# Patient Record
Sex: Female | Born: 1965 | Race: Black or African American | Hispanic: No | Marital: Married | Smoking: Former smoker
Health system: Southern US, Community
[De-identification: ages and names within clinical notes are randomized; demographics above are authoritative.]

## PROBLEM LIST (undated history)

## (undated) DIAGNOSIS — E079 Disorder of thyroid, unspecified: Secondary | ICD-10-CM

## (undated) DIAGNOSIS — M549 Dorsalgia, unspecified: Secondary | ICD-10-CM

## (undated) DIAGNOSIS — E739 Lactose intolerance, unspecified: Secondary | ICD-10-CM

## (undated) DIAGNOSIS — E119 Type 2 diabetes mellitus without complications: Secondary | ICD-10-CM

## (undated) DIAGNOSIS — R600 Localized edema: Secondary | ICD-10-CM

## (undated) DIAGNOSIS — G4730 Sleep apnea, unspecified: Secondary | ICD-10-CM

## (undated) DIAGNOSIS — G8929 Other chronic pain: Secondary | ICD-10-CM

## (undated) DIAGNOSIS — E039 Hypothyroidism, unspecified: Secondary | ICD-10-CM

## (undated) DIAGNOSIS — M797 Fibromyalgia: Secondary | ICD-10-CM

## (undated) DIAGNOSIS — M2550 Pain in unspecified joint: Secondary | ICD-10-CM

## (undated) HISTORY — PX: THYROID SURGERY: SHX805

## (undated) HISTORY — DX: Pain in unspecified joint: M25.50

## (undated) HISTORY — DX: Dorsalgia, unspecified: M54.9

## (undated) HISTORY — DX: Sleep apnea, unspecified: G47.30

## (undated) HISTORY — DX: Type 2 diabetes mellitus without complications: E11.9

## (undated) HISTORY — DX: Hypothyroidism, unspecified: E03.9

## (undated) HISTORY — DX: Other chronic pain: G89.29

## (undated) HISTORY — DX: Lactose intolerance, unspecified: E73.9

## (undated) HISTORY — PX: TUBAL LIGATION: SHX77

## (undated) HISTORY — DX: Localized edema: R60.0

## (undated) HISTORY — DX: Disorder of thyroid, unspecified: E07.9

---

## 2019-03-27 ENCOUNTER — Ambulatory Visit (INDEPENDENT_AMBULATORY_CARE_PROVIDER_SITE_OTHER): Payer: No Typology Code available for payment source | Admitting: Family Medicine

## 2019-03-27 ENCOUNTER — Other Ambulatory Visit: Payer: Self-pay

## 2019-03-27 VITALS — BP 104/70 | HR 70 | Ht 66.0 in | Wt 200.2 lb

## 2019-03-27 DIAGNOSIS — E559 Vitamin D deficiency, unspecified: Secondary | ICD-10-CM

## 2019-03-27 DIAGNOSIS — Z131 Encounter for screening for diabetes mellitus: Secondary | ICD-10-CM

## 2019-03-27 DIAGNOSIS — E119 Type 2 diabetes mellitus without complications: Secondary | ICD-10-CM

## 2019-03-27 DIAGNOSIS — Z634 Disappearance and death of family member: Secondary | ICD-10-CM

## 2019-03-27 DIAGNOSIS — S3992XS Unspecified injury of lower back, sequela: Secondary | ICD-10-CM

## 2019-03-27 DIAGNOSIS — F4320 Adjustment disorder, unspecified: Secondary | ICD-10-CM

## 2019-03-27 DIAGNOSIS — Z8639 Personal history of other endocrine, nutritional and metabolic disease: Secondary | ICD-10-CM

## 2019-03-27 DIAGNOSIS — Z0000 Encounter for general adult medical examination without abnormal findings: Secondary | ICD-10-CM

## 2019-03-27 DIAGNOSIS — E0500 Thyrotoxicosis with diffuse goiter without thyrotoxic crisis or storm: Secondary | ICD-10-CM

## 2019-03-27 LAB — POCT GLYCOSYLATED HEMOGLOBIN (HGB A1C): Hemoglobin A1C: 6.4 % — AB (ref 4.0–5.6)

## 2019-03-27 NOTE — Patient Instructions (Signed)
It was lovely to meet you!  1. We are checking some labs for your thyroid health, diabetes, cholesterol and kidney function. I will let you know about any abnormalities.   2. I will check with our behavioral health specialists and refer you for therapy for bereavement.   Be Well!  Dr. Leary Roca
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
## 2019-03-28 LAB — LIPID PANEL
Chol/HDL Ratio: 4.2 ratio (ref 0.0–4.4)
Cholesterol, Total: 182 mg/dL (ref 100–199)
HDL: 43 mg/dL (ref >39)
LDL Chol Calc (NIH): 106 mg/dL — ABNORMAL HIGH (ref 0–99)
Triglycerides: 192 mg/dL — ABNORMAL HIGH (ref 0–149)
VLDL Cholesterol Cal: 33 mg/dL (ref 5–40)

## 2019-03-28 LAB — BASIC METABOLIC PANEL
BUN/Creatinine Ratio: 10 (ref 9–23)
BUN: 13 mg/dL (ref 6–24)
CO2: 22 mmol/L (ref 20–29)
Calcium: 9.7 mg/dL (ref 8.7–10.2)
Chloride: 103 mmol/L (ref 96–106)
Creatinine, Ser: 1.25 mg/dL — ABNORMAL HIGH (ref 0.57–1.00)
GFR calc Af Amer: 57 mL/min/{1.73_m2} — ABNORMAL LOW (ref >59)
GFR calc non Af Amer: 49 mL/min/{1.73_m2} — ABNORMAL LOW (ref >59)
Glucose: 79 mg/dL (ref 65–99)
Potassium: 4.2 mmol/L (ref 3.5–5.2)
Sodium: 143 mmol/L (ref 134–144)

## 2019-03-28 LAB — TSH: TSH: 93.4 u[IU]/mL — ABNORMAL HIGH (ref 0.450–4.500)

## 2019-03-28 LAB — VITAMIN D 25 HYDROXY (VIT D DEFICIENCY, FRACTURES): Vit D, 25-Hydroxy: 19.7 ng/mL — ABNORMAL LOW (ref 30.0–100.0)

## 2019-03-28 LAB — T4, FREE: Free T4: 0.19 ng/dL — ABNORMAL LOW (ref 0.82–1.77)

## 2019-03-29 ENCOUNTER — Encounter: Payer: Self-pay | Admitting: Family Medicine

## 2019-03-29 DIAGNOSIS — E119 Type 2 diabetes mellitus without complications: Secondary | ICD-10-CM | POA: Insufficient documentation

## 2019-03-29 DIAGNOSIS — E0500 Thyrotoxicosis with diffuse goiter without thyrotoxic crisis or storm: Secondary | ICD-10-CM | POA: Insufficient documentation

## 2019-03-29 DIAGNOSIS — E559 Vitamin D deficiency, unspecified: Secondary | ICD-10-CM | POA: Insufficient documentation

## 2019-03-29 DIAGNOSIS — Z0000 Encounter for general adult medical examination without abnormal findings: Secondary | ICD-10-CM | POA: Insufficient documentation

## 2019-03-29 DIAGNOSIS — F4320 Adjustment disorder, unspecified: Secondary | ICD-10-CM | POA: Insufficient documentation

## 2019-03-29 DIAGNOSIS — S3992XS Unspecified injury of lower back, sequela: Secondary | ICD-10-CM | POA: Insufficient documentation

## 2019-03-29 NOTE — Assessment & Plan Note (Signed)
Last colonoscopy 2018. Patient does not know recommendations for next screening, she will check records and get back to me.  Cannot remember last pap smear, needs to be done at next exam.  Can do HIV, HCV screens at next exam.  Last mammogram in 2019, due for mammogram this year.

## 2019-03-29 NOTE — Assessment & Plan Note (Addendum)
Patient consistently checks blood sugar at home within acceptable range 120-130s. Patient makes consistent effort to diet and exercise, and has lost about 15-20 lbs in the last few months; she has had to buy new pants and a new belt due to weight loss, doing quite well in this regard. No medications since summer 2020 - Hgb A1c checked today - Will discuss appropriate medications (if any) based on A1c today. Patient has tired glimepiride, metformin, and jardiance in the past. Cannot tolerate metformin, many hypoglycemic episodes with glimepiride. - Will do foot exam, urine microalbumin at next check - Lipid panel, CMP ordered

## 2019-03-29 NOTE — Assessment & Plan Note (Signed)
H/o vit d deficiency. Patient previously on 1000 U Vit D daily. Would like to be rechecked. - Vit D 25 hydroxy obtained

## 2019-03-29 NOTE — Assessment & Plan Note (Signed)
Patient recently lost her mother, experiencing normal sx of bereavement. Discussed risks and benefits of medical treatment including SSRIs and sleep medication such as trazodone and benadryl. Does not wish medical treatment at this time, but would like to talk to a therapist to process her emotions. - Can take benadryl 25-50 mg PO qhs PRN - Will cc chart to Dr. Shawnee Knapp (on for behavioral health today) for recommendations re: bereavement therapy
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
## 2019-03-29 NOTE — Assessment & Plan Note (Signed)
Previously on levothyroxine 127 mcg. Not on any medications since summer 2020. - TSH and FT4 today - Based on labs will determine if patient requires medication

## 2019-04-07 ENCOUNTER — Telehealth: Payer: Self-pay

## 2019-04-07 NOTE — Telephone Encounter (Signed)
Patient calls nurse line requesting lab results from office visit on 03/27/19. Patient has questions regarding medication management related to these lab results.   Patient also has not heard back regarding referral for therapist.    To PCP  Please advise  Veronda Prude, RN

## 2019-04-08 ENCOUNTER — Telehealth: Payer: Self-pay | Admitting: Psychology

## 2019-04-08 NOTE — Telephone Encounter (Signed)
Called and left VM about scheduling appt.

## 2019-04-08 NOTE — Telephone Encounter (Signed)
We have an appointment next week for follow up. Will discuss labs then. I am currently on nights. Will forward for referral re: therapy.  Shirlean Mylar, MD [facility], PGY-1

## 2019-04-16 ENCOUNTER — Other Ambulatory Visit: Payer: Self-pay

## 2019-04-16 ENCOUNTER — Encounter: Payer: Self-pay | Admitting: Family Medicine

## 2019-04-16 ENCOUNTER — Ambulatory Visit (INDEPENDENT_AMBULATORY_CARE_PROVIDER_SITE_OTHER): Payer: No Typology Code available for payment source | Admitting: Family Medicine

## 2019-04-16 VITALS — BP 110/86 | HR 69 | Wt 202.0 lb

## 2019-04-16 DIAGNOSIS — E119 Type 2 diabetes mellitus without complications: Secondary | ICD-10-CM | POA: Diagnosis not present

## 2019-04-16 DIAGNOSIS — F4320 Adjustment disorder, unspecified: Secondary | ICD-10-CM

## 2019-04-16 DIAGNOSIS — S3992XS Unspecified injury of lower back, sequela: Secondary | ICD-10-CM

## 2019-04-16 DIAGNOSIS — Z634 Disappearance and death of family member: Secondary | ICD-10-CM

## 2019-04-16 DIAGNOSIS — M5442 Lumbago with sciatica, left side: Secondary | ICD-10-CM

## 2019-04-16 DIAGNOSIS — E0500 Thyrotoxicosis with diffuse goiter without thyrotoxic crisis or storm: Secondary | ICD-10-CM

## 2019-04-16 DIAGNOSIS — M5441 Lumbago with sciatica, right side: Secondary | ICD-10-CM

## 2019-04-16 MED ORDER — LEVOTHYROXINE SODIUM 125 MCG PO TABS: 125.0000 ug | ORAL_TABLET | ORAL | 3 refills | Status: DC
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
## 2019-04-16 NOTE — Patient Instructions (Signed)
It was a pleasure seeing you today!  Please follow up with Dr. Shawnee Knapp.   I have given you the number of our nutritionist, Dr. Gerilyn Pilgrim. Call her number to schedule an appointment.   For your back pain: I recommend continuing your home stretching and exercise. You can use heat as needed, and voltaryn gel. I have ordered back x-rays for you, please see the hand out for instructions.  Your levothyroxine should be ready at your pharmacy.  I will follow up with you in 2 months, unless your pain is worse.  Be Well!  Dr. Leary Roca

## 2019-04-18 ENCOUNTER — Encounter: Payer: Self-pay | Admitting: Family Medicine

## 2019-04-18 NOTE — Assessment & Plan Note (Signed)
Levothyroxine 125 mcg prescribed, this was the last dose she was stable on. Will recheck in 2 months.

## 2019-04-18 NOTE — Assessment & Plan Note (Signed)
Continue diet and exercise regimen. Will recheck A1c in 6 months.

## 2019-04-18 NOTE — Assessment & Plan Note (Signed)
Due to tenderness on central column 1 month after injury, recommend x-rays of thoracic and lumbar spine to rule out fracture or lesion. No red flags. Most likely musculoskeletal from injury, expect that since it has improved already, it will continue to improve.  -Offered PT, patient does not yet want to go to PT as she is improving -Continue ice and OTC analgesics

## 2019-04-18 NOTE — Assessment & Plan Note (Signed)
Patient is improving, but still would like therapy. She will make an appointment with Dr. Shawnee Knapp.

## 2019-04-21 ENCOUNTER — Ambulatory Visit
Admission: RE | Admit: 2019-04-21 | Discharge: 2019-04-21 | Disposition: A | Discharge status: Home / Self Care | Payer: No Typology Code available for payment source | Source: Ambulatory Visit | Attending: Family Medicine | Admitting: Family Medicine

## 2019-04-21 ENCOUNTER — Other Ambulatory Visit: Payer: Self-pay | Admitting: Family Medicine

## 2019-04-21 DIAGNOSIS — M5441 Lumbago with sciatica, right side: Secondary | ICD-10-CM

## 2019-04-21 DIAGNOSIS — M5442 Lumbago with sciatica, left side: Secondary | ICD-10-CM

## 2019-04-21 DIAGNOSIS — E0500 Thyrotoxicosis with diffuse goiter without thyrotoxic crisis or storm: Secondary | ICD-10-CM

## 2019-04-22 ENCOUNTER — Telehealth: Payer: Self-pay | Admitting: Psychology

## 2019-04-22 ENCOUNTER — Ambulatory Visit: Payer: No Typology Code available for payment source | Admitting: Psychology

## 2019-04-22 NOTE — Telephone Encounter (Signed)
Pt called upset reporting she had a mental break-down in [employer].  Pt denied HI/SI and reported she is just overwhelmed.  Pt requested appt with Dr. Shawnee Knapp.  Scheduled appt for 3/17 at 830AM.    Pt was given suicide hotline number and information about Behavioral Health Urgent care at [facility].

## 2019-04-29 ENCOUNTER — Other Ambulatory Visit: Payer: Self-pay

## 2019-04-29 ENCOUNTER — Ambulatory Visit (INDEPENDENT_AMBULATORY_CARE_PROVIDER_SITE_OTHER): Payer: No Typology Code available for payment source | Admitting: Psychology

## 2019-04-29 DIAGNOSIS — F4321 Adjustment disorder with depressed mood: Secondary | ICD-10-CM | POA: Diagnosis not present

## 2019-04-29 NOTE — BH Specialist Note (Signed)
Integrated Behavioral Health Visit   04/29/2019 Donna Cowan [id number]   Session Start time: 830  Session End time: 9 Total time: 30   PRESENTING CONCERNS: Patient and/or family reports the following symptoms/concerns: Pt reported she lost her mother in January and has been struggling with the grieving process.  PT reported she has lots of thoughts about her and misses her. Pt reported have a "meltdown" last week in [place] where she has a memory of her mother and became paralyzed with emotion.  Pt and Dr. Shawnee Knapp talked through process of grieving and grace needed to move through process.   Pt denied SI.  Duration of problem: 3 months; Severity of problem: mild  STRENGTHS (Protective Factors/Coping Skills): Supportive family   GOALS ADDRESSED: Patient will: 1.  Reduce symptoms of: depression: syx of sadness and loss 2.  Increase knowledge and/or ability of: coping skills: pt need some coping skills to move through grief. 3.  Demonstrate ability to: Begin healthy grieving over loss  INTERVENTIONS: Interventions utilized:  Supportive Counseling Standardized Assessments completed: Not Needed  ASSESSMENT: Patient currently experiencing grief due to loss of her mother.   Patient may benefit from engaging in coping skills to move through grief and supportive counseling.  PLAN: 1. Follow up with behavioral health clinician on : grief process  2. Behavioral recommendations: supportive therapy 3. Referral(s): [facility] [employer] (In Clinic)  Royetta Asal, PhD., LMFT-A

## 2019-05-13 ENCOUNTER — Other Ambulatory Visit: Payer: Self-pay

## 2019-05-13 ENCOUNTER — Ambulatory Visit (INDEPENDENT_AMBULATORY_CARE_PROVIDER_SITE_OTHER): Payer: No Typology Code available for payment source | Admitting: Psychology

## 2019-05-13 DIAGNOSIS — F4321 Adjustment disorder with depressed mood: Secondary | ICD-10-CM

## 2019-05-13 NOTE — BH Specialist Note (Signed)
Integrated Behavioral Health Visit  05/13/2019 Donna Cowan [id number]   Session Start time: 830  Session End time: 910 Total time: 40   Referring Provider: Dr. Leary Roca   PRESENTING CONCERNS: Patient and/or family reports the following symptoms/concerns: Pt reported she is still experiencing grief syx.  Pt reports she struggles with anger and sadness.  Pt and Dr. Shawnee Knapp discussed process of grief and how sharing where she is can be helpful for her supports.   Duration of problem: since January; Severity of problem: mild  STRENGTHS (Protective Factors/Coping Skills): Supports in place   GOALS ADDRESSED: Patient will: 1.  Reduce symptoms of: grief : sadness, loss, anger, weight loss (20lbs) 2.  Increase knowledge and/or ability of: coping skills: pt needs some coping skills and self-management strategies to move through grief  3.  Demonstrate ability to: Begin healthy grieving over loss  INTERVENTIONS: Interventions utilized:  Supportive Counseling Standardized Assessments completed: Not Needed  ASSESSMENT: Patient currently experiencing grief due to the loss of her mother.   Patient may benefit from engaging in coping skills and supportive counseling to move through grief.   PLAN: 1. Follow up with behavioral health clinician on : grief process; pt's [facility] insurance coverage and if needed referral out. 2. Behavioral recommendations: supportive therapy 3. Referral(s): [facility] [employer] (In Clinic)  Royetta Asal, PhD., LMFT-A

## 2019-08-12 ENCOUNTER — Other Ambulatory Visit: Payer: Self-pay

## 2019-08-12 ENCOUNTER — Encounter (INDEPENDENT_AMBULATORY_CARE_PROVIDER_SITE_OTHER): Payer: Self-pay | Admitting: Bariatrics

## 2019-08-12 ENCOUNTER — Ambulatory Visit (INDEPENDENT_AMBULATORY_CARE_PROVIDER_SITE_OTHER): Payer: No Typology Code available for payment source | Admitting: Bariatrics

## 2019-08-12 VITALS — BP 129/92 | HR 60 | Temp 98.4°F | Ht 67.0 in | Wt 202.0 lb

## 2019-08-12 DIAGNOSIS — E119 Type 2 diabetes mellitus without complications: Secondary | ICD-10-CM

## 2019-08-12 DIAGNOSIS — E6609 Other obesity due to excess calories: Secondary | ICD-10-CM

## 2019-08-12 DIAGNOSIS — Z6831 Body mass index (BMI) 31.0-31.9, adult: Secondary | ICD-10-CM

## 2019-08-12 DIAGNOSIS — Z9189 Other specified personal risk factors, not elsewhere classified: Secondary | ICD-10-CM

## 2019-08-12 DIAGNOSIS — R5383 Other fatigue: Secondary | ICD-10-CM | POA: Diagnosis not present

## 2019-08-12 DIAGNOSIS — E669 Obesity, unspecified: Secondary | ICD-10-CM

## 2019-08-12 DIAGNOSIS — F3289 Other specified depressive episodes: Secondary | ICD-10-CM

## 2019-08-12 DIAGNOSIS — Z0289 Encounter for other administrative examinations: Secondary | ICD-10-CM

## 2019-08-12 DIAGNOSIS — E559 Vitamin D deficiency, unspecified: Secondary | ICD-10-CM

## 2019-08-12 DIAGNOSIS — R0602 Shortness of breath: Secondary | ICD-10-CM

## 2019-08-12 DIAGNOSIS — E0500 Thyrotoxicosis with diffuse goiter without thyrotoxic crisis or storm: Secondary | ICD-10-CM

## 2019-08-12 NOTE — Progress Notes (Signed)
Chief Complaint:   OBESITY Donna Cowan (MR# [id number]) is a 54 y.o. female who presents for evaluation and treatment of obesity and related comorbidities. Current BMI is Body mass index is 31.64 kg/m.Donna Cowan has been struggling with her weight for many years and has been unsuccessful in either losing weight, maintaining weight loss, or reaching her healthy weight goal.  [name] is currently in the action stage of change and ready to dedicate time achieving and maintaining a healthier weight. [name] is interested in becoming our patient and working on intensive lifestyle modifications including (but not limited to) diet and exercise for weight loss.  Donna Cowan is lactose intolerant. She does like to cook. She sometimes craves "salty stuff."  Donna Cowan's habits were reviewed today and are as follows: Her family eats meals together, she struggles with family and or coworkers weight loss sabotage, her desired weight loss is 27 lbs, she started gaining weight in 2013, her heaviest weight ever was 235 pounds, she sometimes craves salty stuff and sometimes sweet stuff, she snacks frequently in the evenings, she sometimes makes poor food choices, she sometimes eats larger portions than normal, she has binge eating behaviors and she struggles with emotional eating.  Depression Screen Donna Cowan's Food and Mood (modified PHQ-9) score was 10.  Depression screen PHQ 2/9 08/12/2019  Decreased Interest 1  Down, Depressed, Hopeless 1  PHQ - 2 Score 2  Altered sleeping 1  Tired, decreased energy 2  Change in appetite 3  Feeling bad or failure about yourself  2  Trouble concentrating 0  Moving slowly or fidgety/restless 0  Suicidal thoughts 0  PHQ-9 Score 10  Difficult doing work/chores Somewhat difficult   Subjective:   Other fatigue. Theone denies daytime somnolence and denies waking up still tired. Donna Cowan generally gets 7.5 hours of sleep per night, and states that  she has generally restful sleep. Snoring is present. Apneic episodes are present. Epworth Sleepiness Score is 8.  Shortness of breath on exertion. Donna Cowan notes increasing shortness of breath with certain activities and seems to be worsening over time with weight gain. She notes getting out of breath sooner with activity than she used to. This has gotten worse recently. Donna Cowan denies shortness of breath at rest or orthopnea.  Type 2 diabetes mellitus without complication, without long-term current use of insulin (HCC). [name] is on no medication.  Lab Results  Component Value Date   HGBA1C 6.4 (A) 03/27/2019   Lab Results  Component Value Date   LDLCALC 106 (H) 03/27/2019   CREATININE 1.25 (H) 03/27/2019   No results found for: INSULIN  Vitamin D deficiency. Emmarie takes OTC Vitamin D.   Ref. Range 03/27/2019 14:08  Vitamin D, 25-Hydroxy Latest Ref Range: 30.0 - 100.0 ng/mL 19.7 (L)   Graves disease. Daphine is taking Synthroid. Last TSH 93.400 on 03/27/2019.  Other depression, with emotional eating. [name] is struggling with emotional eating and using food for comfort to the extent that it is negatively impacting her health. She has been working on behavior modification techniques to help reduce her emotional eating and has been somewhat successful. She shows no sign of suicidal or homicidal ideations. Donna Cowan endorses emotional eating.  At risk for hypoglycemia. Donna Cowan is at increased risk for hypoglycemia due to diabetes mellitus type II and dietary changes.   Assessment/Plan:   Other fatigue. [name] does feel that her weight is causing her energy to be lower than it should be. Fatigue may be related to obesity, depression or many  other causes. Labs will be ordered, and in the meanwhile, Donna Cowan will focus on self care including making healthy food choices, increasing physical activity and focusing on stress reduction. EKG 12-Lead performed  today.  Shortness of breath on exertion. Donna Cowan does feel that she gets out of breath more easily that she used to when she exercises. Donna Cowan's shortness of breath appears to be obesity related and exercise induced. She has agreed to work on weight loss and gradually increase exercise to treat her exercise induced shortness of breath. Will continue to monitor closely. Lipid Panel With LDL/HDL Ratio ordered today.  Type 2 diabetes mellitus without complication, without long-term current use of insulin (HCC). Good blood sugar control is important to decrease the likelihood of diabetic complications such as nephropathy, neuropathy, limb loss, blindness, coronary artery disease, and death. Intensive lifestyle modification including diet, exercise and weight loss are the first line of treatment for diabetes. Hemoglobin A1c, Insulin, random, Comprehensive metabolic panel labs ordered today.  Vitamin D deficiency. Low Vitamin D level contributes to fatigue and are associated with obesity, breast, and colon cancer. VITAMIN D 25 Hydroxy (Vit-D Deficiency, Fractures) level ordered today.  Graves disease. T3, T4, free, TSH levels will be checked today.  Other depression, with emotional eating. Behavior modification techniques were discussed today to help Donna Cowan deal with her emotional/non-hunger eating behaviors.  Orders and follow up as documented in patient record. [name] will be referred to Dr. Dewaine Conger, our bariatric psychologist, for evaluation.  At risk for hypoglycemia. Donna Cowan was given approximately 15 minutes of counseling today regarding prevention of hypoglycemia. She was advised of symptoms of hypoglycemia. Donna Cowan was instructed to avoid skipping meals, eat regular protein rich meals and schedule low calorie snacks as needed.   Repetitive spaced learning was employed today to elicit superior memory formation and behavioral change.  Class 1 obesity with serious comorbidity and  body mass index (BMI) of 31.0 to 31.9 in adult, unspecified obesity type.  Donna Cowan is currently in the action stage of change and her goal is to continue with weight loss efforts. I recommend Donna Cowan begin the structured treatment plan as follows:  She has agreed to the Category 1 Plan + 100 calories.  She will work on meal planning, intentional eating, and will read labels.  We independently reviewed with the patient labs from 03/27/2019 including CMP, lipids, Vitamin D, TSH, and T4.  Exercise goals: All adults should avoid inactivity. Some physical activity is better than none, and adults who participate in any amount of physical activity gain some health benefits.   Behavioral modification strategies: increasing lean protein intake, decreasing simple carbohydrates, increasing vegetables, increasing water intake, decreasing eating out, no skipping meals, meal planning and cooking strategies, keeping healthy foods in the home and planning for success.  She was informed of the importance of frequent follow-up visits to maximize her success with intensive lifestyle modifications for her multiple health conditions. She was informed we would discuss her lab results at her next visit unless there is a critical issue that needs to be addressed sooner. Skyley agreed to keep her next visit at the agreed upon time to discuss these results.  Objective:   Blood pressure (!) 129/92, pulse 60, temperature 98.4 F (36.9 C), height 5\' 7"  (1.702 m), weight 202 lb (91.6 kg), SpO2 97 %. Body mass index is 31.64 kg/m.  EKG: Sinus  Rhythm with a rate of 60 BPM. Nonspecific T-abnormality. Abnormal.  Indirect Calorimeter completed today shows a VO2 of 211 and a  REE of 1466.  Her calculated basal metabolic rate is 7517 thus her basal metabolic rate is worse than expected.  General: Cooperative, alert, well developed, in no acute distress. HEENT: Conjunctivae and lids unremarkable. Cardiovascular:  Regular rhythm.  Lungs: Normal work of breathing. Neurologic: No focal deficits.   Lab Results  Component Value Date   CREATININE 1.25 (H) 03/27/2019   BUN 13 03/27/2019   NA 143 03/27/2019   K 4.2 03/27/2019   CL 103 03/27/2019   CO2 22 03/27/2019   No results found for: ALT, AST, GGT, ALKPHOS, BILITOT Lab Results  Component Value Date   HGBA1C 6.4 (A) 03/27/2019   No results found for: INSULIN Lab Results  Component Value Date   TSH 93.400 (H) 03/27/2019   Lab Results  Component Value Date   CHOL 182 03/27/2019   HDL 43 03/27/2019   LDLCALC 106 (H) 03/27/2019   TRIG 192 (H) 03/27/2019   CHOLHDL 4.2 03/27/2019   No results found for: WBC, HGB, HCT, MCV, PLT No results found for: IRON, TIBC, FERRITIN  Attestation Statements:   Reviewed by clinician on day of visit: allergies, medications, problem list, medical history, surgical history, family history, social history, and previous encounter notes.  Fernanda Drum, am acting as Energy manager for [employer], DO   I have reviewed the above documentation for accuracy and completeness, and I agree with the above. Corinna Capra, DO
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
## 2019-08-14 LAB — COMPREHENSIVE METABOLIC PANEL
ALT: 21 IU/L (ref 0–32)
AST: 22 IU/L (ref 0–40)
Albumin/Globulin Ratio: 1.8 (ref 1.2–2.2)
Albumin: 4.6 g/dL (ref 3.8–4.9)
Alkaline Phosphatase: 82 IU/L (ref 48–121)
BUN/Creatinine Ratio: 16 (ref 9–23)
BUN: 18 mg/dL (ref 6–24)
Bilirubin Total: 0.4 mg/dL (ref 0.0–1.2)
CO2: 24 mmol/L (ref 20–29)
Calcium: 9.8 mg/dL (ref 8.7–10.2)
Chloride: 101 mmol/L (ref 96–106)
Creatinine, Ser: 1.15 mg/dL — ABNORMAL HIGH (ref 0.57–1.00)
GFR calc Af Amer: 62 mL/min/{1.73_m2} (ref >59)
GFR calc non Af Amer: 54 mL/min/{1.73_m2} — ABNORMAL LOW (ref >59)
Globulin, Total: 2.6 g/dL (ref 1.5–4.5)
Glucose: 103 mg/dL — ABNORMAL HIGH (ref 65–99)
Potassium: 4.4 mmol/L (ref 3.5–5.2)
Sodium: 139 mmol/L (ref 134–144)
Total Protein: 7.2 g/dL (ref 6.0–8.5)

## 2019-08-14 LAB — LIPID PANEL WITH LDL/HDL RATIO
Cholesterol, Total: 176 mg/dL (ref 100–199)
HDL: 41 mg/dL (ref >39)
LDL Chol Calc (NIH): 117 mg/dL — ABNORMAL HIGH (ref 0–99)
LDL/HDL Ratio: 2.9 ratio (ref 0.0–3.2)
Triglycerides: 99 mg/dL (ref 0–149)
VLDL Cholesterol Cal: 18 mg/dL (ref 5–40)

## 2019-08-14 LAB — HEMOGLOBIN A1C
Est. average glucose Bld gHb Est-mCnc: 143 mg/dL
Hgb A1c MFr Bld: 6.6 % — ABNORMAL HIGH (ref 4.8–5.6)

## 2019-08-14 LAB — INSULIN, RANDOM: INSULIN: 14.8 u[IU]/mL (ref 2.6–24.9)

## 2019-08-14 LAB — TSH: TSH: 6.15 u[IU]/mL — ABNORMAL HIGH (ref 0.450–4.500)

## 2019-08-14 LAB — T4, FREE: Free T4: 1.32 ng/dL (ref 0.82–1.77)

## 2019-08-14 LAB — VITAMIN D 25 HYDROXY (VIT D DEFICIENCY, FRACTURES): Vit D, 25-Hydroxy: 22.7 ng/mL — ABNORMAL LOW (ref 30.0–100.0)

## 2019-08-14 LAB — T3: T3, Total: 99 ng/dL (ref 71–180)

## 2019-08-25 ENCOUNTER — Telehealth (INDEPENDENT_AMBULATORY_CARE_PROVIDER_SITE_OTHER): Payer: No Typology Code available for payment source | Admitting: Psychology

## 2019-08-25 ENCOUNTER — Other Ambulatory Visit: Payer: Self-pay

## 2019-08-25 DIAGNOSIS — F329 Major depressive disorder, single episode, unspecified: Secondary | ICD-10-CM

## 2019-08-25 DIAGNOSIS — F5089 Other specified eating disorder: Secondary | ICD-10-CM

## 2019-08-25 DIAGNOSIS — F32A Depression, unspecified: Secondary | ICD-10-CM

## 2019-08-26 ENCOUNTER — Other Ambulatory Visit: Payer: Self-pay

## 2019-08-26 ENCOUNTER — Encounter (INDEPENDENT_AMBULATORY_CARE_PROVIDER_SITE_OTHER): Payer: Self-pay | Admitting: Bariatrics

## 2019-08-26 ENCOUNTER — Ambulatory Visit (INDEPENDENT_AMBULATORY_CARE_PROVIDER_SITE_OTHER): Payer: No Typology Code available for payment source | Admitting: Bariatrics

## 2019-08-26 VITALS — BP 108/72 | HR 64 | Temp 97.9°F | Ht 67.0 in | Wt 201.0 lb

## 2019-08-26 DIAGNOSIS — E038 Other specified hypothyroidism: Secondary | ICD-10-CM | POA: Diagnosis not present

## 2019-08-26 DIAGNOSIS — F3289 Other specified depressive episodes: Secondary | ICD-10-CM

## 2019-08-26 DIAGNOSIS — E1169 Type 2 diabetes mellitus with other specified complication: Secondary | ICD-10-CM | POA: Diagnosis not present

## 2019-08-26 DIAGNOSIS — Z9189 Other specified personal risk factors, not elsewhere classified: Secondary | ICD-10-CM

## 2019-08-26 DIAGNOSIS — Z6831 Body mass index (BMI) 31.0-31.9, adult: Secondary | ICD-10-CM

## 2019-08-26 DIAGNOSIS — E559 Vitamin D deficiency, unspecified: Secondary | ICD-10-CM | POA: Diagnosis not present

## 2019-08-26 DIAGNOSIS — E669 Obesity, unspecified: Secondary | ICD-10-CM

## 2019-08-26 MED ORDER — LEVOTHYROXINE SODIUM 137 MCG PO TABS: 137.0000 ug | ORAL_TABLET | Freq: Every day | ORAL | 0 refills | Status: DC

## 2019-08-26 MED ORDER — VITAMIN D (ERGOCALCIFEROL) 1.25 MG (50000 UNIT) PO CAPS: 50000.0000 [IU] | ORAL_CAPSULE | ORAL | 0 refills | Status: DC

## 2019-08-26 MED ORDER — LEVOTHYROXINE SODIUM 125 MCG PO TABS: 125.0000 ug | ORAL_TABLET | ORAL | 3 refills | Status: DC

## 2019-08-26 NOTE — Progress Notes (Signed)
  Office: [phone]  /  Fax: [phone]    Date: September 09, 2019   Appointment Start Time: 8:30am Duration: 30 minutes Provider: Lawerance Cruel, Psy.D. Type of Session: Individual Therapy  Location of Patient: Home Location of Provider: Provider's Home Type of Contact: Telepsychological Visit via MyChart Video Visit  Session Content: Donna Cowan is a 54 y.o. female presenting via MyChart Video Visit for a follow-up appointment to address the previously established treatment goal of increasing coping skills. Today's appointment was a telepsychological visit due to COVID-19. Donna Cowan provided verbal consent for today's telepsychological appointment and she is aware she is responsible for securing confidentiality on her end of the session. Prior to proceeding with today's appointment, Donna Cowan's physical location at the time of this appointment was obtained as well a phone number she could be reached at in the event of technical difficulties. [name] and this provider participated in today's telepsychological service. Of note, this provider called Donna Cowan at 8:32am due to audio issues and she provided verbal consent to proceed via a telephone call.   This provider conducted a brief check-in. Donna Cowan shared she has been struggling with her daughter's upcoming move to [place]. Associated thoughts and feelings were processed. She acknowledged the aforementioned has impacted her eating habits and sleep. She also shared about other ongoing stressors, including insurance coverage for mental health appointments. She was encouraged to contact her insurance company to discuss further; she agreed. Due to ongoing sleeping difficulties, psychoeducation regarding sleep hygiene was provided. Donna Cowan provided verbal consent during today's appointment for this provider to send a handout on sleep hygiene via e-mail. Donna Cowan was receptive to today's appointment as evidenced by openness to sharing,  responsiveness to feedback, and willingness to implement discussed strategies.  Mental Status Examination:  Appearance: well groomed and appropriate hygiene  Behavior: appropriate to circumstances Mood: sad Affect: mood congruent Speech: normal in rate, volume, and tone Eye Contact: appropriate Psychomotor Activity: appropriate Gait: unable to assess Thought Process: linear, logical, and goal directed  Thought Content/Perception: denies suicidal and homicidal ideation, plan, and intent and no hallucinations, delusions, bizarre thinking or behavior reported or observed Orientation: time, person, place, and purpose of appointment Memory/Concentration: memory, attention, language, and fund of knowledge intact  Insight/Judgment: fair  Interventions:  Conducted a brief chart review Provided empathic reflections and validation Employed supportive psychotherapy interventions to facilitate reduced distress and to improve coping skills with identified stressors Psychoeducation provided regarding sleep hygiene provided  DSM-5 Diagnosis(es): 307.59 (F50.8) Other Specified Feeding or Eating Disorder, Emotional Eating and Binge Eating Behaviors and 311 (F32.9) Unspecified Depressive Disorder  Treatment Goal & Progress: During the initial appointment with this provider, the following treatment goal was established: increase coping skills. Progress is limited, as Donna Cowan has just begun treatment with this provider; however, she is receptive to the interaction and interventions and rapport is being established.   Plan: The next appointment will be scheduled in approximately two weeks, which will be via MyChart Video Visit. The next session will focus on working towards the established treatment goal.

## 2019-08-27 ENCOUNTER — Encounter (INDEPENDENT_AMBULATORY_CARE_PROVIDER_SITE_OTHER): Payer: Self-pay | Admitting: Bariatrics

## 2019-08-27 NOTE — Progress Notes (Signed)
Chief Complaint:   OBESITY Donna Cowan is here to discuss her progress with her obesity treatment plan along with follow-up of her obesity related diagnoses. Donna Cowan is on the Category 1 Plan + 100 calories and states she is following her eating plan approximately 50% of the time. Donna Cowan states she is exercising 0 minutes 0 times per week.  Today's visit was #: 2 Starting weight: 202 lbs Starting date: 08/12/2019 Today's weight: 201 lbs Today's date: 08/26/2019 Total lbs lost to date: 1 Total lbs lost since last in-office visit: 1  Interim History: Donna Cowan has lost 1 lb. She reports doing well with breakfast and lunch, but struggles more with snacking in the evening and at night. She states that her mother died in 03-10-22 and she has struggled since that time.  Subjective:   Vitamin D deficiency. Donna Cowan is taking OTC Vitamin D supplementation.    Ref. Range 08/13/2019 08:00  Vitamin D, 25-Hydroxy Latest Ref Range: 30.0 - 100.0 ng/mL 22.7 (L)   Diabetes mellitus type 2 in obese (HCC). Donna Cowan is on no medications.   Lab Results  Component Value Date   HGBA1C 6.6 (H) 08/13/2019   HGBA1C 6.4 (A) 03/27/2019   Lab Results  Component Value Date   LDLCALC 117 (H) 08/13/2019   CREATININE 1.15 (H) 08/13/2019   Lab Results  Component Value Date   INSULIN 14.8 08/13/2019   Other specified hypothyroidism. TSH was 6.150 on 08/13/2019, previously was 93.4 on 03/27/2019. Donna Cowan is taking synthroid 125 mcg.  Lab Results  Component Value Date   TSH 6.150 (H) 08/13/2019   Other depression, with emotional eating. Donna Cowan is struggling with emotional eating and using food for comfort to the extent that it is negatively impacting her health. She has been working on behavior modification techniques to help reduce her emotional eating and has been somewhat successful. She shows no sign of suicidal or homicidal ideations. Donna Cowan reports emotional/stress  eating. She is seeing Donna Cowan.  At risk for osteoporosis. Donna Cowan is at higher risk of osteopenia and osteoporosis due to Vitamin D deficiency.   Assessment/Plan:   Vitamin D deficiency. Low Vitamin D level contributes to fatigue and are associated with obesity, breast, and colon cancer. She was given a prescription for Vitamin D, Ergocalciferol, (DRISDOL) 1.25 MG (50000 UNIT) CAPS capsule every week #4 with 0 refills and will follow-up for routine testing of Vitamin D, at least 2-3 times per year to avoid over-replacement.   Diabetes mellitus type 2 in obese (HCC). Good blood sugar control is important to decrease the likelihood of diabetic complications such as nephropathy, neuropathy, limb loss, blindness, coronary artery disease, and death. Intensive lifestyle modification including diet, exercise and weight loss are the first line of treatment for diabetes.   Other specified hypothyroidism. Patient with long-standing hypothyroidism, on levothyroxine therapy. She appears euthyroid. Orders and follow up as documented in patient record. Prescription was given for levothyroxine (SYNTHROID) 137 MCG tablet 1 PO daily before breakfast #30 with 0 refills. Thyroid panel will be rechecked in 6 weeks.  Counseling . Good thyroid control is important for overall health. Supratherapeutic thyroid levels are dangerous and will not improve weight loss results.  . The correct way to take levothyroxine is fasting, with water, separated by at least 30 minutes from breakfast, and separated by more than 4 hours from calcium, iron, multivitamins, acid reflux medications (PPIs).    Other depression, with emotional eating. Behavior modification techniques were discussed today to help Donna Cowan deal  with her emotional/non-hunger eating behaviors.  Orders and follow up as documented in patient record. Donna Cowan will continue to follow-up with Donna Cowan as scheduled and as directed. She will see a grief  counselor.  At risk for osteoporosis. Donna Cowan was given approximately 15 minutes of osteoporosis prevention counseling today. Donna Cowan is at risk for osteopenia and osteoporosis due to her Vitamin D deficiency. She was encouraged to take her Vitamin D and follow her higher calcium diet and increase strengthening exercise to help strengthen her bones and decrease her risk of osteopenia and osteoporosis.  Repetitive spaced learning was employed today to elicit superior memory formation and behavioral change.  Class 1 obesity with serious comorbidity and body mass index (BMI) of 31.0 to 31.9 in adult, unspecified obesity type.  Donna Cowan is currently in the action stage of change. As such, her goal is to continue with weight loss efforts. She has agreed to the Category 1 Plan + 100 calories.  She will work on meal planning, intentional eating, and increasing her water intake.    We reviewed with the patient labs from 08/13/2019 including CMP, lipids, Vitamin D, A1c, insulin, and thyroid panel.  Exercise goals: All adults should avoid inactivity. Some physical activity is better than none, and adults who participate in any amount of physical activity gain some health benefits.  Behavioral modification strategies: increasing lean protein intake, decreasing simple carbohydrates, increasing vegetables, increasing water intake, decreasing eating out, no skipping meals, meal planning and cooking strategies, keeping healthy foods in the home and planning for success.  Donna Cowan has agreed to follow-up with our clinic in 2 weeks. She was informed of the importance of frequent follow-up visits to maximize her success with intensive lifestyle modifications for her multiple health conditions.   Objective:   Blood pressure 108/72, pulse 64, temperature 97.9 F (36.6 C), height 5\' 7"  (1.702 m), weight 201 lb (91.2 kg), SpO2 99 %. Body mass index is 31.48 kg/m.  General: Cooperative, alert, well  developed, in no acute distress. HEENT: Conjunctivae and lids unremarkable. Cardiovascular: Regular rhythm.  Lungs: Normal work of breathing. Neurologic: No focal deficits.   Lab Results  Component Value Date   CREATININE 1.15 (H) 08/13/2019   BUN 18 08/13/2019   NA 139 08/13/2019   K 4.4 08/13/2019   CL 101 08/13/2019   CO2 24 08/13/2019   Lab Results  Component Value Date   ALT 21 08/13/2019   AST 22 08/13/2019   ALKPHOS 82 08/13/2019   BILITOT 0.4 08/13/2019   Lab Results  Component Value Date   HGBA1C 6.6 (H) 08/13/2019   HGBA1C 6.4 (A) 03/27/2019   Lab Results  Component Value Date   INSULIN 14.8 08/13/2019   Lab Results  Component Value Date   TSH 6.150 (H) 08/13/2019   Lab Results  Component Value Date   CHOL 176 08/13/2019   HDL 41 08/13/2019   LDLCALC 117 (H) 08/13/2019   TRIG 99 08/13/2019   CHOLHDL 4.2 03/27/2019   No results found for: WBC, HGB, HCT, MCV, PLT No results found for: IRON, TIBC, FERRITIN  Attestation Statements:   Reviewed by clinician on day of visit: allergies, medications, problem list, medical history, surgical history, family history, social history, and previous encounter notes.  05/25/2019, am acting as Fernanda Drum for Energy manager, DO   I have reviewed the above documentation for accuracy and completeness, and I agree with the above. [employer], DO

## 2019-09-09 ENCOUNTER — Telehealth (INDEPENDENT_AMBULATORY_CARE_PROVIDER_SITE_OTHER): Payer: No Typology Code available for payment source | Admitting: Psychology

## 2019-09-09 DIAGNOSIS — F5089 Other specified eating disorder: Secondary | ICD-10-CM

## 2019-09-09 DIAGNOSIS — F329 Major depressive disorder, single episode, unspecified: Secondary | ICD-10-CM

## 2019-09-09 DIAGNOSIS — F32A Depression, unspecified: Secondary | ICD-10-CM

## 2019-09-10 NOTE — Progress Notes (Signed)
Office: [phone]  /  Fax: [phone]    Date: September 24, 2019   Appointment Start Time: 8:23am Duration: 36 minutes Provider: Lawerance Cruel, Psy.D. Type of Session: Individual Therapy  Location of Patient: Home Location of Provider: Provider's Home Type of Contact: Telepsychological Visit via MyChart Video Visit  Session Content: Donna Cowan is a 54 y.o. female presenting via MyChart Video Visit for a follow-up appointment to address the previously established treatment goal of increasing coping skills. Today's appointment was a telepsychological visit due to COVID-19. Donna Cowan provided verbal consent for today's telepsychological appointment and she is aware she is responsible for securing confidentiality on her end of the session. Prior to proceeding with today's appointment, Donna Cowan's physical location at the time of this appointment was obtained as well a phone number she could be reached at in the event of technical difficulties. Donna Cowan and this provider participated in today's telepsychological service. Of note, today's appointment was switched to a regular telephone call at 8:24am with Donna Cowan's verbal consent due to technical issues.   This provider conducted a brief check-in. Donna Cowan shared therapeutic services are not covered by her insurance, adding she set up a payment plan as she wishes to continue to meeting with this provider. She was receptive to this provider sending referral options for providers offering a sliding scale fee option via e-mail for longer-term therapeutic services. She also discussed coping with past trauma by visiting her father's burial site and childhood home. Donna Cowan noted, "It was good for me." Moreover, Donna Cowan reported ongoing challenges with falling asleep but discussed implementing sleep hygiene strategies that have helped some. She shared a plan to continue implementing learned strategies.    Donna Cowan further discussed her  daughter's upcoming move, noting the aforementioned resulted in her engaging in emotional eating last night. Psychoeducation regarding all or nothing thinking was provided. Additionally, psychoeducation regarding triggers for emotional eating was provided. Donna Cowan was provided a handout, and encouraged to utilize the handout between now and the next appointment to increase awareness of triggers and frequency. Donna Cowan agreed. This provider also discussed behavioral strategies for specific triggers, such as placing the utensil down when conversing to avoid mindless eating. Donna Cowan provided verbal consent during today's appointment for this provider to send a handout about triggers via e-mail. Donna Cowan was receptive to today's appointment as evidenced by openness to sharing, responsiveness to feedback, and willingness to explore triggers for emotional eating.  Mental Status Examination:  Appearance: well groomed and appropriate hygiene  Behavior: appropriate to circumstances Mood: sad Affect: mood congruent Speech: normal in rate, volume, and tone Eye Contact: appropriate Psychomotor Activity: appropriate Gait: unable to assess Thought Process: linear, logical, and goal directed  Thought Content/Perception: no hallucinations, delusions, bizarre thinking or behavior reported or observed and denied suicidal and homicidal ideation, plan, and intent Orientation: time, person, place, and purpose of appointment Memory/Concentration: memory, attention, language, and fund of knowledge intact  Insight/Judgment: fair  Interventions:  Conducted a brief chart review Provided empathic reflections and validation Reviewed content from the previous session Employed supportive psychotherapy interventions to facilitate reduced distress and to improve coping skills with identified stressors Psychoeducation provided regarding triggers for emotional eating Psychoeducation provided regarding all-or-nothing  thinking  DSM-5 Diagnosis(es): 307.59 (F50.8) Other Specified Feeding or Eating Disorder, Emotional Eating and Binge Eating Behaviors and 311 (F32.9) Unspecified Depressive Disorder  Treatment Goal & Progress: During the initial appointment with this provider, the following treatment goal was established: increase coping skills. Donna Cowan has demonstrated progress in her goal as evidenced  by increased awareness of hunger patterns. Donna Cowan also continues to demonstrate willingness to engage in learned skill(s).  Plan: The next appointment will be scheduled in two weeks, which will be via MyChart Video Visit. The next session will focus on working towards the established treatment goal.

## 2019-09-15 ENCOUNTER — Ambulatory Visit (INDEPENDENT_AMBULATORY_CARE_PROVIDER_SITE_OTHER): Payer: No Typology Code available for payment source | Admitting: Bariatrics

## 2019-09-15 ENCOUNTER — Other Ambulatory Visit: Payer: Self-pay

## 2019-09-15 ENCOUNTER — Encounter (INDEPENDENT_AMBULATORY_CARE_PROVIDER_SITE_OTHER): Payer: Self-pay | Admitting: Bariatrics

## 2019-09-15 VITALS — BP 120/80 | HR 56 | Temp 98.2°F | Ht 67.0 in | Wt 203.0 lb

## 2019-09-15 DIAGNOSIS — E038 Other specified hypothyroidism: Secondary | ICD-10-CM

## 2019-09-15 DIAGNOSIS — E669 Obesity, unspecified: Secondary | ICD-10-CM

## 2019-09-15 DIAGNOSIS — Z6831 Body mass index (BMI) 31.0-31.9, adult: Secondary | ICD-10-CM

## 2019-09-15 DIAGNOSIS — F3289 Other specified depressive episodes: Secondary | ICD-10-CM | POA: Diagnosis not present

## 2019-09-15 DIAGNOSIS — Z9189 Other specified personal risk factors, not elsewhere classified: Secondary | ICD-10-CM

## 2019-09-15 DIAGNOSIS — E559 Vitamin D deficiency, unspecified: Secondary | ICD-10-CM

## 2019-09-15 MED ORDER — VITAMIN D (ERGOCALCIFEROL) 1.25 MG (50000 UNIT) PO CAPS: 50000.0000 [IU] | ORAL_CAPSULE | ORAL | 0 refills | Status: DC

## 2019-09-15 MED ORDER — LEVOTHYROXINE SODIUM 137 MCG PO TABS: 137.0000 ug | ORAL_TABLET | Freq: Every day | ORAL | 0 refills | Status: DC

## 2019-09-15 NOTE — Progress Notes (Signed)
Chief Complaint:   OBESITY Donna Cowan is here to discuss her progress with her obesity treatment plan along with follow-up of her obesity related diagnoses. Donna Cowan is on the Category 1 Plan + 100 calories and states she is following her eating plan approximately 40-50% of the time. Donna Cowan states she is exercising 0 minutes 0 times per week.  Today's visit was #: 3 Starting weight: 202 lbs Starting date: 08/12/2019 Today's weight: 203 lbs Today's date: 09/15/2019 Total lbs lost to date: 0 Total lbs lost since last in-office visit: 0  Interim History: Donna Cowan is up 2 lbs.  Subjective:   Vitamin D deficiency. No nausea, vomiting, or muscle weakness.    Ref. Range 08/13/2019 08:00  Vitamin D, 25-Hydroxy Latest Ref Range: 30.0 - 100.0 ng/mL 22.7 (L)   Other specified hypothyroidism. Donna Cowan is taking Synthroid.   Lab Results  Component Value Date   TSH 6.150 (H) 08/13/2019   Other depression, with emotional eating. Donna Cowan is struggling with emotional eating and using food for comfort to the extent that it is negatively impacting her health. She has been working on behavior modification techniques to help reduce her emotional eating and has been somewhat successful. She shows no sign of suicidal or homicidal ideations. Donna Cowan has seen Dr. Dewaine Conger.  At risk for dehydration. Donna Cowan is at increased risk for dehydration due to summer weather and increased exercise.  Assessment/Plan:   Vitamin D deficiency. Low Vitamin D level contributes to fatigue and are associated with obesity, breast, and colon cancer. She was given a prescription for Vitamin D, Ergocalciferol, (DRISDOL) 1.25 MG (50000 UNIT) CAPS capsule every week #4 with 0 refills and will follow-up for routine testing of Vitamin D, at least 2-3 times per year to avoid over-replacement.   Other specified hypothyroidism. Patient with long-standing hypothyroidism, on levothyroxine therapy. She appears  euthyroid. Orders and follow up as documented in patient record. Prescription was given for levothyroxine (SYNTHROID) 137 MCG tablet 1 PO daily #30 with 0 refills.  Counseling . Good thyroid control is important for overall health. Supratherapeutic thyroid levels are dangerous and will not improve weight loss results. . The correct way to take levothyroxine is fasting, with water, separated by at least 30 minutes from breakfast, and separated by more than 4 hours from calcium, iron, multivitamins, acid reflux medications (PPIs).   Other depression, with emotional eating. Behavior modification techniques were discussed today to help Donna Cowan deal with her emotional/non-hunger eating behaviors.  Orders and follow up as documented in patient record. We discussed with [facility] emotional and stress eating.  At risk for dehydration. Donna Cowan was given approximately 15 minutes dehydration prevention counseling today. Donna Cowan is at risk for dehydration due to weight loss and current medication(s). She was encouraged to hydrate and monitor fluid status to avoid dehydration as well as weight loss plateaus.   Class 1 obesity with serious comorbidity and body mass index (BMI) of 31.0 to 31.9 in adult, unspecified obesity type.  Donna Cowan is currently in the action stage of change. As such, her goal is to continue with weight loss efforts. She has agreed to the Category 1 Plan + 100 calories.   She will work on meal planning, intentional eating, and increasing her water intake (will drink water before anything else).   Handout was provided on Eating Out.  Exercise goals: Donna Cowan will increase exercise/activities (Zumba and HIIT exercise).  Behavioral modification strategies: increasing lean protein intake, decreasing simple carbohydrates, increasing vegetables, increasing water intake, decreasing eating  out, no skipping meals, meal planning and cooking strategies, keeping healthy foods in the  home and planning for success.  Donna Cowan has agreed to follow-up with our clinic in 2 weeks. She was informed of the importance of frequent follow-up visits to maximize her success with intensive lifestyle modifications for her multiple health conditions.   Objective:   Blood pressure 120/80, pulse (!) 56, temperature 98.2 F (36.8 C), height 5\' 7"  (1.702 m), weight 203 lb (92.1 kg), SpO2 100 %. Body mass index is 31.79 kg/m.  General: Cooperative, alert, well developed, in no acute distress. HEENT: Conjunctivae and lids unremarkable. Cardiovascular: Regular rhythm.  Lungs: Normal work of breathing. Neurologic: No focal deficits.   Lab Results  Component Value Date   CREATININE 1.15 (H) 08/13/2019   BUN 18 08/13/2019   NA 139 08/13/2019   K 4.4 08/13/2019   CL 101 08/13/2019   CO2 24 08/13/2019   Lab Results  Component Value Date   ALT 21 08/13/2019   AST 22 08/13/2019   ALKPHOS 82 08/13/2019   BILITOT 0.4 08/13/2019   Lab Results  Component Value Date   HGBA1C 6.6 (H) 08/13/2019   HGBA1C 6.4 (A) 03/27/2019   Lab Results  Component Value Date   INSULIN 14.8 08/13/2019   Lab Results  Component Value Date   TSH 6.150 (H) 08/13/2019   Lab Results  Component Value Date   CHOL 176 08/13/2019   HDL 41 08/13/2019   LDLCALC 117 (H) 08/13/2019   TRIG 99 08/13/2019   CHOLHDL 4.2 03/27/2019   No results found for: WBC, HGB, HCT, MCV, PLT No results found for: IRON, TIBC, FERRITIN  Attestation Statements:   Reviewed by clinician on day of visit: allergies, medications, problem list, medical history, surgical history, family history, social history, and previous encounter notes.  05/25/2019, am acting as Fernanda Drum for Energy manager, DO   I have reviewed the above documentation for accuracy and completeness, and I agree with the above. [employer], DO

## 2019-09-21 ENCOUNTER — Encounter (INDEPENDENT_AMBULATORY_CARE_PROVIDER_SITE_OTHER): Payer: Self-pay | Admitting: Bariatrics

## 2019-09-24 ENCOUNTER — Other Ambulatory Visit: Payer: Self-pay

## 2019-09-24 ENCOUNTER — Telehealth (INDEPENDENT_AMBULATORY_CARE_PROVIDER_SITE_OTHER): Payer: No Typology Code available for payment source | Admitting: Psychology

## 2019-09-24 DIAGNOSIS — F329 Major depressive disorder, single episode, unspecified: Secondary | ICD-10-CM | POA: Diagnosis not present

## 2019-09-24 DIAGNOSIS — F5089 Other specified eating disorder: Secondary | ICD-10-CM

## 2019-09-24 DIAGNOSIS — F32A Depression, unspecified: Secondary | ICD-10-CM

## 2019-09-24 NOTE — Progress Notes (Unsigned)
Office: [phone]  /  Fax: [phone]    Date: October 08, 2019   Appointment Start Time: *** Duration: *** minutes Provider: Lawerance Cruel, Psy.D. Type of Session: Individual Therapy  Location of Patient: {gbptloc:23249} Location of Provider: Provider's Home Type of Contact: Telepsychological Visit via MyChart Video Visit  Session Content: This provider called Donna Cowan at 8:32am as she did not present for the telepsychological appointment. She forgot about today's appointment. As such, today's appointment was initiated *** minutes late.  Donna Cowan is a 54 y.o. female presenting for a follow-up appointment to address the previously established treatment goal of increasing coping skills. Today's appointment was a telepsychological visit due to COVID-19. Donna Cowan provided verbal consent for today's telepsychological appointment and she is aware she is responsible for securing confidentiality on her end of the session. Prior to proceeding with today's appointment, Donna Cowan's physical location at the time of this appointment was obtained as well a phone number she could be reached at in the event of technical difficulties. Donna Cowan and this provider participated in today's telepsychological service.   This provider conducted a brief check-in and verbally administered the PHQ-9 and GAD-7. *** Donna Cowan was receptive to today's appointment as evidenced by openness to sharing, responsiveness to feedback, and {gbreceptiveness:23401}.  Mental Status Examination:  Appearance: {Appearance:22431} Behavior: {Behavior:22445} Mood: {gbmood:21757} Affect: {Affect:22436} Speech: {Speech:22432} Eye Contact: {Eye Contact:22433} Psychomotor Activity: {Motor Activity:22434} Gait: {gbgait:23404} Thought Process: {thought process:22448}  Thought Content/Perception: {disturbances:22451} Orientation: {Orientation:22437} Memory/Concentration: {gbcognition:22449} Insight/Judgment:  {Insight:22446}  Structured Assessments Results: The Patient Health Questionnaire-9 (PHQ-9) is a self-report measure that assesses symptoms and severity of depression over the course of the last two weeks. Donna Cowan obtained a score of *** suggesting {GBPHQ9SEVERITY:21752}. Donna Cowan finds the endorsed symptoms to be {gbphq9difficulty:21754}. [0= Not at all; 1= Several days; 2= More than half the days; 3= Nearly every day] Little interest or pleasure in doing things ***  Feeling down, depressed, or hopeless ***  Trouble falling or staying asleep, or sleeping too much ***  Feeling tired or having little energy ***  Poor appetite or overeating ***  Feeling bad about yourself --- or that you are a failure or have let yourself or your family down ***  Trouble concentrating on things, such as reading the newspaper or watching television ***  Moving or speaking so slowly that other people could have noticed? Or the opposite --- being so fidgety or restless that you have been moving around a lot more than usual ***  Thoughts that you would be better off dead or hurting yourself in some way ***  PHQ-9 Score ***    The Generalized Anxiety Disorder-7 (GAD-7) is a brief self-report measure that assesses symptoms of anxiety over the course of the last two weeks. Donna Cowan obtained a score of *** suggesting {gbgad7severity:21753}. Donna Cowan finds the endorsed symptoms to be {gbphq9difficulty:21754}. [0= Not at all; 1= Several days; 2= Over half the days; 3= Nearly every day] Feeling nervous, anxious, on edge ***  Not being able to stop or control worrying ***  Worrying too much about different things ***  Trouble relaxing ***  Being so restless that it's hard to sit still ***  Becoming easily annoyed or irritable ***  Feeling afraid as if something awful might happen ***  GAD-7 Score ***   Interventions:  {Interventions for Progress Notes:23405}  DSM-5 Diagnosis(es): 307.59 (F50.8) Other  Specified Feeding or Eating Disorder, Emotional Eating and Binge Eating Behaviors and 311 (F32.9) Unspecified Depressive Disorder  Treatment Goal & Progress: During the  initial appointment with this provider, the following treatment goal was established: increase coping skills. Donna Cowan has demonstrated progress in her goal as evidenced by {gbtxprogress:22839}. Donna Cowan also {gbtxprogress2:22951}.  Plan: The next appointment will be scheduled in {gbweeks:21758}, which will be {gbtxmodality:23402}. The next session will focus on {Plan for Next Appointment:23400}.

## 2019-10-01 ENCOUNTER — Ambulatory Visit (INDEPENDENT_AMBULATORY_CARE_PROVIDER_SITE_OTHER): Payer: No Typology Code available for payment source | Admitting: Bariatrics

## 2019-10-05 ENCOUNTER — Ambulatory Visit (INDEPENDENT_AMBULATORY_CARE_PROVIDER_SITE_OTHER): Payer: No Typology Code available for payment source | Admitting: Bariatrics

## 2019-10-06 ENCOUNTER — Ambulatory Visit: Payer: No Typology Code available for payment source | Admitting: Professional

## 2019-10-08 ENCOUNTER — Telehealth (INDEPENDENT_AMBULATORY_CARE_PROVIDER_SITE_OTHER): Payer: No Typology Code available for payment source | Admitting: Psychology

## 2019-10-08 ENCOUNTER — Telehealth (INDEPENDENT_AMBULATORY_CARE_PROVIDER_SITE_OTHER): Payer: Self-pay | Admitting: Psychology

## 2019-10-08 NOTE — Telephone Encounter (Signed)
  Office: [phone]  /  Fax: [phone]  Date of Call: October 08, 2019  Time of Call: 8:32am Duration of Call: 3 minutes Provider: Lawerance Cruel, PsyD  CONTENT: This provider called Cherly Hensen to check-in as she did not present for today's MyChart Video Visit appointment at 8:30am. Avary stated she forgot about today's appointment, adding she just woke up. As such, she requested to reschedule today's appointment. She acknowledged understanding that the one time no show fee waiver will be applied for today's appointment.  A brief risk assessment was completed. Samia denied experiencing suicidal and homicidal ideation, plan, or intent since the last appointment with this provider.   PLAN: Kimiya was rescheduled for an appointment on October 12, 2019 at 4:00pm via MyChart Video Visit.

## 2019-10-08 NOTE — Progress Notes (Unsigned)
Office: [phone]  /  Fax: [phone]    Date: October 12, 2019   Appointment Start Time: *** Duration: *** minutes Provider: Lawerance Cruel, Psy.D. Type of Session: Individual Therapy  Location of Patient: {gbptloc:23249} Location of Provider: [facility] Type of Contact: Telepsychological Visit via MyChart Video Visit  Session Content:This provider called Donna Cowan at 4:03pm as she did not present for the telepsychological appointment. *** As such, today's appointment was initiated *** minutes late.  Donna Cowan is a 54 y.o. female presenting for a follow-up appointment to address the previously established treatment goal of increasing coping skills. Today's appointment was a telepsychological visit due to COVID-19. Donna Cowan provided verbal consent for today's telepsychological appointment and she is aware she is responsible for securing confidentiality on her end of the session. Prior to proceeding with today's appointment, Donna Cowan's physical location at the time of this appointment was obtained as well a phone number she could be reached at in the event of technical difficulties. Donna Cowan and this provider participated in today's telepsychological service.   This provider conducted a brief check-in and verbally administered the PHQ-9 and GAD-7. *** Reviewed triggers for emotional eating. Donna Cowan was receptive to today's appointment as evidenced by openness to sharing, responsiveness to feedback, and {gbreceptiveness:23401}.  Mental Status Examination:  Appearance: {Appearance:22431} Behavior: {Behavior:22445} Mood: {gbmood:21757} Affect: {Affect:22436} Speech: {Speech:22432} Eye Contact: {Eye Contact:22433} Psychomotor Activity: {Motor Activity:22434} Gait: {gbgait:23404} Thought Process: {thought process:22448}  Thought Content/Perception: {disturbances:22451} Orientation: {Orientation:22437} Memory/Concentration:  {gbcognition:22449} Insight/Judgment: {Insight:22446}  Structured Assessments Results: The Patient Health Questionnaire-9 (PHQ-9) is a self-report measure that assesses symptoms and severity of depression over the course of the last two weeks. Donna Cowan obtained a score of *** suggesting {GBPHQ9SEVERITY:21752}. Donna Cowan finds the endorsed symptoms to be {gbphq9difficulty:21754}. [0= Not at all; 1= Several days; 2= More than half the days; 3= Nearly every day] Little interest or pleasure in doing things ***  Feeling down, depressed, or hopeless ***  Trouble falling or staying asleep, or sleeping too much ***  Feeling tired or having little energy ***  Poor appetite or overeating ***  Feeling bad about yourself --- or that you are a failure or have let yourself or your family down ***  Trouble concentrating on things, such as reading the newspaper or watching television ***  Moving or speaking so slowly that other people could have noticed? Or the opposite --- being so fidgety or restless that you have been moving around a lot more than usual ***  Thoughts that you would be better off dead or hurting yourself in some way ***  PHQ-9 Score ***    The Generalized Anxiety Disorder-7 (GAD-7) is a brief self-report measure that assesses symptoms of anxiety over the course of the last two weeks. Donna Cowan obtained a score of *** suggesting {gbgad7severity:21753}. Donna Cowan finds the endorsed symptoms to be {gbphq9difficulty:21754}. [0= Not at all; 1= Several days; 2= Over half the days; 3= Nearly every day] Feeling nervous, anxious, on edge ***  Not being able to stop or control worrying ***  Worrying too much about different things ***  Trouble relaxing ***  Being so restless that it's hard to sit still ***  Becoming easily annoyed or irritable ***  Feeling afraid as if something awful might happen ***  GAD-7 Score ***   Interventions:  {Interventions for Progress Notes:23405}  DSM-5  Diagnosis(es): 307.59 (F50.8) Other Specified Feeding or Eating Disorder, Emotional Eating and Binge Eating Behaviors and 311 (F32.9) Unspecified Depressive Disorder  Treatment Goal &  Progress: During the initial appointment with this provider, the following treatment goal was established: increase coping skills. Donna Cowan has demonstrated progress in her goal as evidenced by {gbtxprogress:22839}. Donna Cowan also {gbtxprogress2:22951}.  Plan: The next appointment will be scheduled in {gbweeks:21758}, which will be {gbtxmodality:23402}. The next session will focus on {Plan for Next Appointment:23400}.

## 2019-10-12 ENCOUNTER — Telehealth (INDEPENDENT_AMBULATORY_CARE_PROVIDER_SITE_OTHER): Payer: Self-pay | Admitting: Psychology

## 2019-10-12 ENCOUNTER — Telehealth (INDEPENDENT_AMBULATORY_CARE_PROVIDER_SITE_OTHER): Payer: No Typology Code available for payment source | Admitting: Psychology

## 2019-10-12 ENCOUNTER — Other Ambulatory Visit: Payer: Self-pay

## 2019-10-12 NOTE — Telephone Encounter (Signed)
  Office: [phone]  /  Fax: [phone]  Date of Call: October 12, 2019 Time of Call: 4:03pm Duration of Call: ~2 minutes Provider: Lawerance Cruel, PsyD  CONTENT: This provider called Cherly Hensen to check-in as she did not present for today's MyChart Video Visit appointment at 4:00pm. Cherly Hensen indicated she forgot about today's appointment and noted challenges with her schedule. She also reported ongoing issues with insurance coverage. As such, this provider explained that the no show fee would be waived this one time for the aforementioned reason. She acknowledged understanding and noted she would call back once the issues with insurance were resolved. A brief risk assessment was completed. Camaya denied experiencing suicidal and homicidal ideation, plan, or intent since the last appointment with this provider.   PLAN: This provider will wait for Shalonda to call back. No further follow-up planned by this provider.

## 2019-10-13 ENCOUNTER — Ambulatory Visit (INDEPENDENT_AMBULATORY_CARE_PROVIDER_SITE_OTHER): Payer: No Typology Code available for payment source | Admitting: Bariatrics

## 2019-10-13 ENCOUNTER — Encounter (INDEPENDENT_AMBULATORY_CARE_PROVIDER_SITE_OTHER): Payer: Self-pay | Admitting: Bariatrics

## 2019-10-13 ENCOUNTER — Other Ambulatory Visit: Payer: Self-pay

## 2019-10-13 VITALS — BP 131/90 | HR 55 | Temp 97.9°F | Ht 67.0 in | Wt 205.0 lb

## 2019-10-13 DIAGNOSIS — E669 Obesity, unspecified: Secondary | ICD-10-CM

## 2019-10-13 DIAGNOSIS — E038 Other specified hypothyroidism: Secondary | ICD-10-CM

## 2019-10-13 DIAGNOSIS — E559 Vitamin D deficiency, unspecified: Secondary | ICD-10-CM

## 2019-10-13 DIAGNOSIS — E1169 Type 2 diabetes mellitus with other specified complication: Secondary | ICD-10-CM

## 2019-10-13 DIAGNOSIS — Z6832 Body mass index (BMI) 32.0-32.9, adult: Secondary | ICD-10-CM

## 2019-10-13 DIAGNOSIS — Z9189 Other specified personal risk factors, not elsewhere classified: Secondary | ICD-10-CM | POA: Diagnosis not present

## 2019-10-13 DIAGNOSIS — R030 Elevated blood-pressure reading, without diagnosis of hypertension: Secondary | ICD-10-CM

## 2019-10-13 MED ORDER — VITAMIN D (ERGOCALCIFEROL) 1.25 MG (50000 UNIT) PO CAPS: 50000.0000 [IU] | ORAL_CAPSULE | ORAL | 0 refills | Status: DC

## 2019-10-13 MED ORDER — LEVOTHYROXINE SODIUM 137 MCG PO TABS: 137.0000 ug | ORAL_TABLET | Freq: Every day | ORAL | 0 refills | Status: DC

## 2019-10-13 NOTE — Progress Notes (Signed)
Chief Complaint:   OBESITY Donna Cowan is here to discuss her progress with her obesity treatment plan along with follow-up of her obesity related diagnoses. Donna Cowan is on the Category 1 Plan + 100 calories and states she is following her eating plan approximately 50-60% of the time. [name] states she is exercising 0 minutes 0 times per week.  Today's visit was #: 4 Starting weight: 202 lbs Starting date: 08/12/2019 Today's weight: 205 lbs Today's date: 10/13/2019 Total lbs lost to date: 0 Total lbs lost since last in-office visit: 0  Interim History: Donna Cowan is up 2 lbs since her last visit.  Subjective:   Other specified hypothyroidism. Donna Cowan denies hot or cold intolerance. TSH is slightly elevated at 6.150.   Lab Results  Component Value Date   TSH 6.150 (H) 08/13/2019   Vitamin D deficiency. No nausea, vomiting, or muscle weakness.    Ref. Range 08/13/2019 08:00  Vitamin D, 25-Hydroxy Latest Ref Range: 30.0 - 100.0 ng/mL 22.7 (L)   Type 2 diabetes mellitus with other specified complication, without long-term current use of insulin (HCC). [name] is on no medication and does not want to start any medication.  Lab Results  Component Value Date   HGBA1C 6.6 (H) 08/13/2019   HGBA1C 6.4 (A) 03/27/2019   Lab Results  Component Value Date   LDLCALC 117 (H) 08/13/2019   CREATININE 1.15 (H) 08/13/2019   Lab Results  Component Value Date   INSULIN 14.8 08/13/2019   Elevated blood pressure reading. [name] denies headaches. She is on no medication.  BP Readings from Last 3 Encounters:  10/13/19 131/90  09/15/19 120/80  08/26/19 108/72   At risk for osteoporosis. Donna Cowan is at higher risk of osteopenia and osteoporosis due to Vitamin D deficiency.   Assessment/Plan:   Other specified hypothyroidism. Patient with long-standing hypothyroidism, on levothyroxine therapy. She appears euthyroid. Orders and follow up as documented in patient  record. Prescription was given for levothyroxine (SYNTHROID) 137 MCG tablet 1 PO daily before breakfast #30 with 0 refills.  Counseling . Good thyroid control is important for overall health. Supratherapeutic thyroid levels are dangerous and will not improve weight loss results.  . The correct way to take levothyroxine is fasting, with water, separated by at least 30 minutes from breakfast, and separated by more than 4 hours from calcium, iron, multivitamins, acid reflux medications (PPIs).   Vitamin D deficiency. Low Vitamin D level contributes to fatigue and are associated with obesity, breast, and colon cancer. She was given a prescription for Vitamin D, Ergocalciferol, (DRISDOL) 1.25 MG (50000 UNIT) CAPS capsule every week #4 with 0 refills and will follow-up for routine testing of Vitamin D, at least 2-3 times per year to avoid over-replacement.   Type 2 diabetes mellitus with other specified complication, without long-term current use of insulin (HCC). Good blood sugar control is important to decrease the likelihood of diabetic complications such as nephropathy, neuropathy, limb loss, blindness, coronary artery disease, and death. Intensive lifestyle modification including diet, exercise and weight loss are the first line of treatment for diabetes. Marketta will decrease carbohydrates (sweets and starches).  Elevated blood pressure reading. Will watch blood pressure over time.  At risk for osteoporosis. Donna Cowan was given approximately 15 minutes of osteoporosis prevention counseling today. Donna Cowan is at risk for osteopenia and osteoporosis due to her Vitamin D deficiency. She was encouraged to take her Vitamin D and follow her higher calcium diet and increase strengthening exercise to help strengthen her bones  and decrease her risk of osteopenia and osteoporosis.  Repetitive spaced learning was employed today to elicit superior memory formation and behavioral change.  Class 1 obesity  with serious comorbidity and body mass index (BMI) of 32.0 to 32.9 in adult, unspecified obesity type.  Donna Cowan is currently in the action stage of change. As such, her goal is to continue with weight loss efforts. She has agreed to the Category 1 Plan + 100 calories.  She will work on meal planning and intentional eating.   We reviewed in detail with the patient labs from 08/13/2019.   Exercise goals: Donna Cowan will do workout videos.  Behavioral modification strategies: increasing lean protein intake, decreasing simple carbohydrates, increasing vegetables, increasing water intake, decreasing eating out, no skipping meals, meal planning and cooking strategies, keeping healthy foods in the home and planning for success.  Donna Cowan has agreed to follow-up with our clinic in 2-3 weeks. She was informed of the importance of frequent follow-up visits to maximize her success with intensive lifestyle modifications for her multiple health conditions.   Objective:   Blood pressure 131/90, pulse (!) 55, temperature 97.9 F (36.6 C), height 5\' 7"  (1.702 m), weight 205 lb (93 kg), SpO2 99 %. Body mass index is 32.11 kg/m.  General: Cooperative, alert, well developed, in no acute distress. HEENT: Conjunctivae and lids unremarkable. Cardiovascular: Regular rhythm.  Lungs: Normal work of breathing. Neurologic: No focal deficits.   Lab Results  Component Value Date   CREATININE 1.15 (H) 08/13/2019   BUN 18 08/13/2019   NA 139 08/13/2019   K 4.4 08/13/2019   CL 101 08/13/2019   CO2 24 08/13/2019   Lab Results  Component Value Date   ALT 21 08/13/2019   AST 22 08/13/2019   ALKPHOS 82 08/13/2019   BILITOT 0.4 08/13/2019   Lab Results  Component Value Date   HGBA1C 6.6 (H) 08/13/2019   HGBA1C 6.4 (A) 03/27/2019   Lab Results  Component Value Date   INSULIN 14.8 08/13/2019   Lab Results  Component Value Date   TSH 6.150 (H) 08/13/2019   Lab Results  Component Value Date    CHOL 176 08/13/2019   HDL 41 08/13/2019   LDLCALC 117 (H) 08/13/2019   TRIG 99 08/13/2019   CHOLHDL 4.2 03/27/2019   No results found for: WBC, HGB, HCT, MCV, PLT No results found for: IRON, TIBC, FERRITIN  Attestation Statements:   Reviewed by clinician on day of visit: allergies, medications, problem list, medical history, surgical history, family history, social history, and previous encounter notes.  05/25/2019, am acting as Fernanda Drum for [employer] manager, DO   I have reviewed the above documentation for accuracy and completeness, and I agree with the above. [employer], DO

## 2019-11-03 ENCOUNTER — Ambulatory Visit (INDEPENDENT_AMBULATORY_CARE_PROVIDER_SITE_OTHER): Payer: No Typology Code available for payment source | Admitting: Bariatrics

## 2019-11-03 ENCOUNTER — Encounter (INDEPENDENT_AMBULATORY_CARE_PROVIDER_SITE_OTHER): Payer: Self-pay

## 2020-02-08 NOTE — Patient Instructions (Addendum)
It was a pleasure to see you today!  1. We will get some labs today.  If they are abnormal or we need to do something about them, I will call you.  If they are normal, I will send you a message on MyChart (if it is active) or a letter in the mail.  If you don't hear from [place] in 2 weeks, please call the office  [phone].  2. For diabetes: I will call to get a prior authorization done for jardiance, I expect this to go through in 1-2 days. Start taking jardiance 10 mg (1 pill) daily. If you get sick and have diarrhea, nausea, or vomiting, stop taking the medication and restart when you are feeling better.   3. For your bereavement: I recommend you follow up with a grief counselor. I have attached some resources for how to find a therapist below. I recommend we follow up in 2 weeks to check in. Please take good care of yourself in the mean time, you can't help your first thought, but try to follow up any harsh thoughts with loving affirmations.  4. For foot fungus, try lotrimin powder twice a day for 2 weeks. For foot pain, try RICE: rest, ice, compression, elevation.  5. I have placed an order for a mammogram, call to schedule this at your earliest convenience.  6. I recommend you follow up in 3 months for a pap smear.  7. Please check your records for your next recommend colonoscopy and let me know (10 years vs 3 or 5 years).  8. Please see an ophthalmologist at your earliest convenience and have them send the report to me.   Be Well,  Dr. Leary Roca

## 2020-02-08 NOTE — Progress Notes (Signed)
SUBJECTIVE:   CHIEF COMPLAINT / HPI:   DM:  Patient is a 54 y.o. female who present today for diabetic follow up.   Patient endorses difficulties with diet since last A1C, presently bereaved as anniversary of mother's passing is now. Patient has been more triggered and less functional, gained the weight back.  Home medications include: none, last A1c in July 6.6%. Patient unable to take metformin due to severe diarrhea in the past.  Most recent A1Cs:  Lab Results  Component Value Date   HGBA1C 7.5 (A) 02/09/2020   HGBA1C 6.6 (H) 08/13/2019   HGBA1C 6.4 (A) 03/27/2019   Last Microalbumin, LDL, Creatinine: Lab Results  Component Value Date   LDLCALC 117 (H) 08/13/2019   CREATININE 1.15 (H) 08/13/2019   Patient does not check blood glucose on a regular basis.  Patient is not up to date on diabetic eye. Patient is up to date on diabetic foot exam.  Bereavement: Patient's mother died last 23-Mar-2022, she reports that her grief is quite intense right now. She forces herself to get up everyday and get dressed and shower (works from home), keeps normal routine. However, she noticed she is otherwise having diminished functioning. She was not able to decorate her house for Christmas (something she loves), has trouble making food at home because of decreased energy/focus. She is upset at herself that she has gained back the weight she lost last year. She does have a minister at her church who gives her grief counseling, and she has been involved with a grief support group.  Graves disease: patient has been taking levothyroxine. Will recheck TSH today to assess for control. She reports she is out of medication and needs refill.  Pap smear: cannot remember last pap, needs to be done this year.  Colonoscopy: patient had last in 2018, she will consult her papers for next recommended screening.  Mammogram: needs to be done.  PERTINENT  PMH / PSH: DMT2, Grave's disease  OBJECTIVE:   BP  114/80   Pulse 65   Ht 5\' 7"  (1.702 m)   Wt 225 lb 3.2 oz (102.2 kg)   SpO2 96%   BMI 35.27 kg/m   Nursing note and vitals reviewed GEN: Age-appropriate AA W, resting comfortably in chair, NAD, obese HEENT: NCAT. Exophthalmos. sclera without injection or icterus. MMM.  Neck: Supple, no LAD, thyroid not palpable. Cardiac: Regular rate and rhythm. Normal S1/S2. No murmurs, rubs, or gallops appreciated. 2+ radial pulses. Lungs: Clear bilaterally to ascultation. No increased WOB, no accessory muscle usage. No w/r/r.  Neuro: Alert and at baseline Ext: no edema Psych: Pleasant and appropriate, tearful appropriate time of my conversation when discussing her mother, full affect Diabetic Foot Exam - Simple   Simple Foot Form Visual Inspection See comments: Yes Sensation Testing Intact to touch and monofilament testing bilaterally: Yes Pulse Check Posterior Tibialis and Dorsalis pulse intact bilaterally: Yes Comments Tinea pedis bilateral, no break down in skin, no deformities    Depression screen [facility] 2/9 08/12/2019 04/16/2019 03/27/2019  Decreased Interest 1 1 1   Down, Depressed, Hopeless 1 1 3   PHQ - 2 Score 2 2 4   Altered sleeping 1 - 0  Tired, decreased energy 2 - 1  Change in appetite 3 - 1  Feeling bad or failure about yourself  2 - 0  Trouble concentrating 0 - 0  Moving slowly or fidgety/restless 0 - 0  Suicidal thoughts 0 - 0  PHQ-9 Score 10 - 6  Difficult  doing work/chores Somewhat difficult - -   ASSESSMENT/PLAN:   Bereavement reaction Patient reported that during the year her symptoms improved, but since December started she has had much worsened mood, is often tearful at remembering her mother.  Her mother's birthday is in December, Christmas with an important holiday to them, this is her first holiday without her mother, the anniversary of mother's passing is in January.  Function to be poor right now, patient has good follow-up with grief counselor at church and with  grief support group.  Will check in for follow-up in 2 weeks to ensure patient does not have any worsening of symptoms especially SI (not present currently).  Not think medication is appropriate at this time; expect she will improve as this anniversary passes.  Type 2 diabetes mellitus without complications (HCC) A1c elevated to 7.5% today, up from 6.6% 5 months ago.  Patient had dieted and exercised and lost weight, putting her diabetes in remission.  However in the last month since she is had increased depression and decreased functioning in regards to bereavement, patient has gained back the weight that she lost.  Patient unable to take Metformin as she has intolerable vomiting nausea and diarrhea without medication.  She has tolerated Jardiance well in the past.  Ideally we can start Jardiance to help prevent worsening of A1c, and when patients bereavement improves her functioning is better, hopefully she can begin the diet and exercise to put her diabetes into remission again. - start Jardiance 10 mg daily (see AVS for SEs) - Gave ophthalomology list for eye exam - Foot exam with normal sensation -Patient is hesitant to start any medications, will start with Jardiance first and if able to get diabetes into remission again can consider not starting a statin.  Graves disease Patient on levothyroxine 137 mcg daily.  We will recheck TSH today and refill medication.  Healthcare maintenance Mammogram ordered, as well as hep C and HIV screening. Due for Pap smear, will do in about 3 months after bereavement is likely to improve. Last colonoscopy was 2018, she should get back to me to let me know next recommended screening.     Shirlean Mylar, MD [facility]

## 2020-02-09 ENCOUNTER — Other Ambulatory Visit: Payer: Self-pay

## 2020-02-09 ENCOUNTER — Encounter: Payer: Self-pay | Admitting: Family Medicine

## 2020-02-09 ENCOUNTER — Ambulatory Visit (INDEPENDENT_AMBULATORY_CARE_PROVIDER_SITE_OTHER): Payer: No Typology Code available for payment source | Admitting: Family Medicine

## 2020-02-09 VITALS — BP 114/80 | HR 65 | Ht 67.0 in | Wt 225.2 lb

## 2020-02-09 DIAGNOSIS — E0500 Thyrotoxicosis with diffuse goiter without thyrotoxic crisis or storm: Secondary | ICD-10-CM

## 2020-02-09 DIAGNOSIS — Z1159 Encounter for screening for other viral diseases: Secondary | ICD-10-CM

## 2020-02-09 DIAGNOSIS — B353 Tinea pedis: Secondary | ICD-10-CM

## 2020-02-09 DIAGNOSIS — E1169 Type 2 diabetes mellitus with other specified complication: Secondary | ICD-10-CM | POA: Diagnosis not present

## 2020-02-09 DIAGNOSIS — Z1231 Encounter for screening mammogram for malignant neoplasm of breast: Secondary | ICD-10-CM

## 2020-02-09 DIAGNOSIS — Z634 Disappearance and death of family member: Secondary | ICD-10-CM

## 2020-02-09 DIAGNOSIS — Z114 Encounter for screening for human immunodeficiency virus [HIV]: Secondary | ICD-10-CM

## 2020-02-09 DIAGNOSIS — E119 Type 2 diabetes mellitus without complications: Secondary | ICD-10-CM

## 2020-02-09 DIAGNOSIS — E038 Other specified hypothyroidism: Secondary | ICD-10-CM

## 2020-02-09 DIAGNOSIS — F4320 Adjustment disorder, unspecified: Secondary | ICD-10-CM

## 2020-02-09 DIAGNOSIS — Z0000 Encounter for general adult medical examination without abnormal findings: Secondary | ICD-10-CM

## 2020-02-09 LAB — POCT UA - MICROSCOPIC ONLY: Epithelial cells, urine per micros: >20

## 2020-02-09 LAB — POCT URINALYSIS DIP (MANUAL ENTRY)
Bilirubin, UA: NEGATIVE
Blood, UA: NEGATIVE
Glucose, UA: NEGATIVE mg/dL
Ketones, POC UA: NEGATIVE mg/dL
Nitrite, UA: NEGATIVE
Protein Ur, POC: NEGATIVE mg/dL
Spec Grav, UA: >=1.03 — AB (ref 1.010–1.025)
Urobilinogen, UA: 0.2 E.U./dL
pH, UA: 5.5 (ref 5.0–8.0)

## 2020-02-09 LAB — POCT GLYCOSYLATED HEMOGLOBIN (HGB A1C): Hemoglobin A1C: 7.5 % — AB (ref 4.0–5.6)

## 2020-02-09 MED ORDER — LEVOTHYROXINE SODIUM 137 MCG PO TABS: 137.0000 ug | ORAL_TABLET | Freq: Every day | ORAL | 1 refills | Status: DC

## 2020-02-09 MED ORDER — EMPAGLIFLOZIN 10 MG PO TABS: 10.0000 mg | ORAL_TABLET | Freq: Every day | ORAL | 0 refills | Status: DC

## 2020-02-09 MED ORDER — TOLNAFTATE 1 % EX POWD: 1.0000 "application " | Freq: Two times a day (BID) | CUTANEOUS | 0 refills | Status: DC

## 2020-02-09 NOTE — Assessment & Plan Note (Signed)
Mammogram ordered, as well as hep C and HIV screening. Due for Pap smear, will do in about 3 months after bereavement is likely to improve. Last colonoscopy was 2018, she should get back to me to let me know next recommended screening.

## 2020-02-09 NOTE — Assessment & Plan Note (Signed)
Patient on levothyroxine 137 mcg daily.  We will recheck TSH today and refill medication.

## 2020-02-09 NOTE — Assessment & Plan Note (Signed)
Patient reported that during the year her symptoms improved, but since December started she has had much worsened mood, is often tearful at remembering her mother.  Her mother's birthday is in December, Christmas with an important holiday to them, this is her first holiday without her mother, the anniversary of mother's passing is in January.  Function to be poor right now, patient has good follow-up with grief counselor at church and with grief support group.  Will check in for follow-up in 2 weeks to ensure patient does not have any worsening of symptoms especially SI (not present currently).  Not think medication is appropriate at this time; expect she will improve as this anniversary passes.

## 2020-02-09 NOTE — Assessment & Plan Note (Addendum)
A1c elevated to 7.5% today, up from 6.6% 5 months ago.  Patient had dieted and exercised and lost weight, putting her diabetes in remission.  However in the last month since she is had increased depression and decreased functioning in regards to bereavement, patient has gained back the weight that she lost.  Patient unable to take Metformin as she has intolerable vomiting nausea and diarrhea without medication.  She has tolerated Jardiance well in the past.  Ideally we can start Jardiance to help prevent worsening of A1c, and when patients bereavement improves her functioning is better, hopefully she can begin the diet and exercise to put her diabetes into remission again. - start Jardiance 10 mg daily (see AVS for SEs) - Gave ophthalomology list for eye exam - Foot exam with normal sensation -Patient is hesitant to start any medications, will start with Jardiance first and if able to get diabetes into remission again can consider not starting a statin.

## 2020-02-10 LAB — MICROALBUMIN / CREATININE URINE RATIO
Creatinine, Urine: 244.5 mg/dL
Microalb/Creat Ratio: 6 mg/g creat (ref 0–29)
Microalbumin, Urine: 14 ug/mL

## 2020-02-10 LAB — TSH: TSH: 10.9 u[IU]/mL — ABNORMAL HIGH (ref 0.450–4.500)

## 2020-02-10 LAB — HIV ANTIBODY (ROUTINE TESTING W REFLEX): HIV Screen 4th Generation wRfx: NONREACTIVE

## 2020-02-10 LAB — HEPATITIS C ANTIBODY: Hep C Virus Ab: <0.1 s/co ratio (ref 0.0–0.9)

## 2020-02-11 ENCOUNTER — Encounter: Payer: Self-pay | Admitting: Family Medicine

## 2020-02-11 ENCOUNTER — Other Ambulatory Visit: Payer: Self-pay | Admitting: Family Medicine

## 2020-02-11 DIAGNOSIS — Z1211 Encounter for screening for malignant neoplasm of colon: Secondary | ICD-10-CM

## 2020-02-16 ENCOUNTER — Encounter: Payer: Self-pay | Admitting: Family Medicine

## 2020-02-17 ENCOUNTER — Encounter: Payer: Self-pay | Admitting: Family Medicine

## 2020-02-26 ENCOUNTER — Ambulatory Visit: Payer: No Typology Code available for payment source | Admitting: Family Medicine

## 2020-03-04 ENCOUNTER — Encounter: Payer: Self-pay | Admitting: Family Medicine

## 2020-03-04 ENCOUNTER — Other Ambulatory Visit: Payer: Self-pay

## 2020-03-04 ENCOUNTER — Ambulatory Visit (INDEPENDENT_AMBULATORY_CARE_PROVIDER_SITE_OTHER): Payer: No Typology Code available for payment source | Admitting: Family Medicine

## 2020-03-04 DIAGNOSIS — F4320 Adjustment disorder, unspecified: Secondary | ICD-10-CM | POA: Diagnosis not present

## 2020-03-04 DIAGNOSIS — E0500 Thyrotoxicosis with diffuse goiter without thyrotoxic crisis or storm: Secondary | ICD-10-CM

## 2020-03-04 DIAGNOSIS — E119 Type 2 diabetes mellitus without complications: Secondary | ICD-10-CM | POA: Diagnosis not present

## 2020-03-04 DIAGNOSIS — Z0000 Encounter for general adult medical examination without abnormal findings: Secondary | ICD-10-CM

## 2020-03-04 DIAGNOSIS — Z634 Disappearance and death of family member: Secondary | ICD-10-CM

## 2020-03-04 NOTE — Patient Instructions (Addendum)
It was a pleasure to see you today!  1. I'm glad you are doing well, keep up the good work and let us know if you need any help from [place].  2. You can get a covid test by googling "[place] covid testing" and using the calendar to sign up for an appointment  3. Make an appointment for 6 weeks to check for your thyroid level  4. When you see your eye doctor, have them send [place] a report   Be Well,  Dr. Leary Roca

## 2020-03-04 NOTE — Progress Notes (Signed)
    SUBJECTIVE:   CHIEF COMPLAINT / HPI: f/u grief  Bereavement: patient's mother passed away 1 year ago. Anniversary was yesterday. She reports pretty good spirits today, feels better, able to get out of bed and do normal tasks, PHQ-9 improved to 6 (down from 10), symptoms improved from even 1 month ago. She reports starting to have more energy to partake in cooking, grocery shopping, wanting to exercise again.  DM: patient recently (~10 days ago) started jardiance, she reports good compliance, BG at home b/w 100-150. Next A1c check due in March.  Grave's Disease: TSH recently elevated to 10 from 6. Patient reports that she had run out of levothyroxine, recently started 1 week ago. Denies symptoms of hypothyroidism.  Health maintenance: Patient reminded that she needs pap smear this year, she requests to do it in May (birthday month, easier for her to remember). Last to ophthalmology about 18 months ago, has appointment in February. Counseled on COVID-19 booster, pneumococcal vaccine and TDAP, wishes to wait at this time. Last colonoscopy in 2016 with hyperplastic polyps and recommended to repeat in 5 years, will send . She had mammogram done this month with normal results, will send report.  PERTINENT  PMH / PSH: Graves' disease, DMT2  OBJECTIVE:   BP 124/80   Pulse 66   Ht 5\' 7"  (1.702 m)   Wt 222 lb 6 oz (100.9 kg)   SpO2 99%   BMI 34.83 kg/m   Nursing note and vitals reviewed GEN: age-appropriate AAW resting comfortably in chair, NAD, WNWD HEENT: NCAT. Exophthalmos. Sclera without injection or icterus. MMM.  Cardiac: Regular rate and rhythm. Normal S1/S2. No murmurs, rubs, or gallops appreciated. 2+ radial pulses. Lungs: Clear bilaterally to ascultation. No increased WOB, no accessory muscle usage. No w/r/r. Neuro: Alert and at baseline Psych: Pleasant and appropriate   PHQ9 SCORE ONLY 03/04/2020 08/12/2019 04/16/2019  PHQ-9 Total Score 6 10 2    ASSESSMENT/PLAN:   Bereavement  reaction Patient improving from bereavement status, 1 year anniversary yesterday. Starting to feel better and having more energy to partake in activities again. Expect patient to continue to improve with time. Will continue to monitor, she does not want further interventions at this time.  Type 2 diabetes mellitus without complications (HCC) Patient started jardiance 10 mg about 10 days ago, doing well. Will recheck A1c in March.  Graves disease Last TSH elevated to 10 from 6, patient reports she ran out of medicine and did not restart until 1 week ago. Will recheck in 6-8 weeks, follow up early March.  Healthcare maintenance Pap smear: reminded patient, will do in May Colonoscopy: clarified that last scope in 2016, with 5 year repeat due to polyps. Referral placed. Ophtho: appointment in February, to send report Mammo: done this month, WNL, to send report Immunizations: counseled on COVID booster (not due till March), wants to wait on TDAP and pneumococcal     March, MD [facility]

## 2020-03-06 NOTE — Assessment & Plan Note (Signed)
Patient improving from bereavement status, 1 year anniversary yesterday. Starting to feel better and having more energy to partake in activities again. Expect patient to continue to improve with time. Will continue to monitor, she does not want further interventions at this time.

## 2020-03-06 NOTE — Assessment & Plan Note (Signed)
Pap smear: reminded patient, will do in May Colonoscopy: clarified that last scope in 2016, with 5 year repeat due to polyps. Referral placed. Ophtho: appointment in February, to send report Mammo: done this month, WNL, to send report Immunizations: counseled on COVID booster (not due till March), wants to wait on TDAP and pneumococcal

## 2020-03-06 NOTE — Assessment & Plan Note (Addendum)
Patient started jardiance 10 mg about 10 days ago, doing well. Will recheck A1c in March.

## 2020-03-06 NOTE — Assessment & Plan Note (Signed)
Last TSH elevated to 10 from 6, patient reports she ran out of medicine and did not restart until 1 week ago. Will recheck in 6-8 weeks, follow up early March.

## 2020-03-31 ENCOUNTER — Encounter: Payer: Self-pay | Admitting: Family Medicine

## 2020-03-31 DIAGNOSIS — E038 Other specified hypothyroidism: Secondary | ICD-10-CM

## 2020-03-31 MED ORDER — LEVOTHYROXINE SODIUM 137 MCG PO TABS: 137.0000 ug | ORAL_TABLET | Freq: Every day | ORAL | 1 refills | Status: DC

## 2020-04-15 LAB — HM DIABETES EYE EXAM

## 2020-04-25 ENCOUNTER — Other Ambulatory Visit: Payer: Self-pay | Admitting: Family Medicine

## 2020-04-25 ENCOUNTER — Telehealth: Payer: Self-pay | Admitting: Family Medicine

## 2020-04-25 ENCOUNTER — Encounter: Payer: Self-pay | Admitting: Family Medicine

## 2020-04-25 DIAGNOSIS — E0500 Thyrotoxicosis with diffuse goiter without thyrotoxic crisis or storm: Secondary | ICD-10-CM

## 2020-04-25 DIAGNOSIS — E038 Other specified hypothyroidism: Secondary | ICD-10-CM

## 2020-04-25 NOTE — Telephone Encounter (Signed)
Received refill request for levothyroxine. Patient had elevated TSH to 10 in January, reports she had been out of her medication and recently restarted it, now due for recheck 6-8 weeks later. Recommend she call to schedule a lab appointment at her convenience. I have placed an order for TSH. It's best for me to know if this dose is correct by rechecking TSH prior to prescribing the levothyroxine. Called and left a HIPAA compliant VM.  Shirlean Mylar, MD [facility], PGY-2

## 2020-05-02 ENCOUNTER — Encounter: Payer: Self-pay | Admitting: *Deleted

## 2020-05-18 ENCOUNTER — Other Ambulatory Visit: Payer: Self-pay

## 2020-05-18 ENCOUNTER — Ambulatory Visit (INDEPENDENT_AMBULATORY_CARE_PROVIDER_SITE_OTHER): Payer: No Typology Code available for payment source | Admitting: Family Medicine

## 2020-05-18 VITALS — BP 125/65 | HR 66 | Ht 66.5 in | Wt 220.2 lb

## 2020-05-18 DIAGNOSIS — E0500 Thyrotoxicosis with diffuse goiter without thyrotoxic crisis or storm: Secondary | ICD-10-CM | POA: Diagnosis not present

## 2020-05-18 DIAGNOSIS — H9311 Tinnitus, right ear: Secondary | ICD-10-CM | POA: Diagnosis not present

## 2020-05-18 NOTE — Patient Instructions (Signed)
It was a pleasure to see you today!  1. We are scheduling head imaging, called an MRI. We have to get a prior authorization from your insurance first, then we will schedule the appointment. We will call you as soon as possible to let you know the appointment. Based on the imaging results, that will determine which specialist you should see next (neurology vs ear/nose/throat doctor).   2. I will let you know the result of the blood work when it returns in 1-2 days. I have refilled your thyroid medication.  3. Follow up in 1 month (perhaps sooner) depending on results for the brain imaging. If you notice that you have sudden weakness in your arms or legs, you have sudden vision changes, trouble speaking, or your face is drooping, please call 911 and go to the emergency room for immediate medical evaluation.    Be Well,  Dr. Leary Roca

## 2020-05-18 NOTE — Progress Notes (Signed)
    SUBJECTIVE:   CHIEF COMPLAINT / HPI:   Tinnitus: ringing in R ear only, trouble hearing out of R ear. No whooshing, notes HA's on right side only. Pressure behind R eye, starts during some point of day for several hours. No tearing, no rhinorrhea. Denies vision changes. Ophthalmology recently visited, Dr. Nile Riggs, last month. Patient also reports she has been walking into doors, losing balance, believes it is happening more on right side than left.   Graves' disease: check TSH today.  PERTINENT  PMH / PSH: Graves' disease, DM  OBJECTIVE:   BP 125/65   Pulse 66   Ht 5' 6.5" (1.689 m)   Wt 220 lb 3.2 oz (99.9 kg)   SpO2 96%   BMI 35.01 kg/m   Nursing note and vitals reviewed GEN: age appropriate, AAW, resting comfortably in chair, NAD, obese HEENT: NCAT. Sclera without injection or icterus. MMM. Exophthalmos R>L. No papilledema. Neck: Supple. No LAD, thyroid not palpable Cardiac: Regular rate and rhythm. Normal S1/S2. No murmurs, rubs, or gallops appreciated. 2+ radial pulses. Lungs: Clear bilaterally to ascultation. No increased WOB, no accessory muscle usage. No w/r/r. Neuro: Cranial Nerves II: PERRL.  III,IV, VI: EOMI without ptosis or diplopia.  V: Facial sensation is symmetric to touch VII: Facial movement is symmetric.  VIII: Hearing is intact to voice X: Palate elevates symmetrically XI: Shoulder shrug is symmetric. XII: Tongue is midline without atrophy or fasciculations.  Motor: Tone is normal. Bulk is normal. 4/5 strength is present on right side both upper and lower extremities, noticeably less than left sided strength which is 5/5 Sensory: Sensation is symmetric to light touch in the arms and legs. Deep Tendon Reflexes: 1+ and symmetric in the biceps and patellae.  Ext: no edema Psych: Pleasant and appropriate  ASSESSMENT/PLAN:   Graves disease Refill levothyroxine, check TSH.  Tinnitus of right ear Patient with concerning sx of unilateral tinnitus, new  unilateral headaches, and focal neuologic findings of weakness on the right side. Discussed with Dr. Deirdre Priest, preceptor, will obtain imaging. Consulted radiology, will get MR brain w/wo contrast. Have to get PA, will order urgently. Referral to specialist will be based on results, either ENT or neurology. Will closely follow. Return precautions given, see AVS.     Donna Mylar, MD [facility]

## 2020-05-19 ENCOUNTER — Other Ambulatory Visit: Payer: Self-pay | Admitting: Family Medicine

## 2020-05-19 ENCOUNTER — Encounter: Payer: Self-pay | Admitting: Family Medicine

## 2020-05-19 ENCOUNTER — Telehealth: Payer: Self-pay

## 2020-05-19 DIAGNOSIS — E038 Other specified hypothyroidism: Secondary | ICD-10-CM

## 2020-05-19 DIAGNOSIS — H9311 Tinnitus, right ear: Secondary | ICD-10-CM | POA: Insufficient documentation

## 2020-05-19 LAB — TSH: TSH: 1.24 u[IU]/mL (ref 0.450–4.500)

## 2020-05-19 MED ORDER — LEVOTHYROXINE SODIUM 137 MCG PO TABS: 137.0000 ug | ORAL_TABLET | Freq: Every day | ORAL | 0 refills | Status: DC

## 2020-05-19 NOTE — Telephone Encounter (Signed)
Spoke with [employer] at [facility]. She said that someone has reached out to pt already. And trying to get her in for tomorrow. I gave her the auth# . She said that she will give this information to her insurance rep. Aquilla Solian, CMA

## 2020-05-19 NOTE — Assessment & Plan Note (Signed)
Refill levothyroxine, check TSH.

## 2020-05-19 NOTE — Assessment & Plan Note (Signed)
Patient with concerning sx of unilateral tinnitus, new unilateral headaches, and focal neuologic findings of weakness on the right side. Discussed with Dr. Deirdre Priest, preceptor, will obtain imaging. Consulted radiology, will get MR brain w/wo contrast. Have to get PA, will order urgently. Referral to specialist will be based on results, either ENT or neurology. Will closely follow. Return precautions given, see AVS.

## 2020-05-20 ENCOUNTER — Other Ambulatory Visit: Payer: Self-pay | Admitting: Family Medicine

## 2020-05-20 ENCOUNTER — Ambulatory Visit
Admission: RE | Admit: 2020-05-20 | Discharge: 2020-05-20 | Disposition: A | Discharge status: Home / Self Care | Payer: No Typology Code available for payment source | Source: Ambulatory Visit | Attending: Family Medicine | Admitting: Family Medicine

## 2020-05-20 ENCOUNTER — Other Ambulatory Visit: Payer: Self-pay

## 2020-05-20 DIAGNOSIS — E038 Other specified hypothyroidism: Secondary | ICD-10-CM

## 2020-05-20 DIAGNOSIS — H9311 Tinnitus, right ear: Secondary | ICD-10-CM

## 2020-05-20 MED ORDER — GADOBENATE DIMEGLUMINE 529 MG/ML IV SOLN
20.0000 mL | Freq: Once | INTRAVENOUS | Status: AC | PRN
  Administered 2020-05-20: 20 mL via INTRAVENOUS

## 2020-05-24 ENCOUNTER — Ambulatory Visit: Payer: No Typology Code available for payment source | Admitting: Family Medicine

## 2020-06-01 ENCOUNTER — Other Ambulatory Visit: Payer: Self-pay | Admitting: Family Medicine

## 2020-06-01 DIAGNOSIS — H9311 Tinnitus, right ear: Secondary | ICD-10-CM

## 2020-06-14 ENCOUNTER — Ambulatory Visit (INDEPENDENT_AMBULATORY_CARE_PROVIDER_SITE_OTHER): Payer: No Typology Code available for payment source | Admitting: Family Medicine

## 2020-06-14 ENCOUNTER — Other Ambulatory Visit: Payer: Self-pay

## 2020-06-14 ENCOUNTER — Encounter: Payer: Self-pay | Admitting: Family Medicine

## 2020-06-14 VITALS — BP 108/60 | HR 85 | Ht 67.0 in | Wt 219.1 lb

## 2020-06-14 DIAGNOSIS — E119 Type 2 diabetes mellitus without complications: Secondary | ICD-10-CM

## 2020-06-14 DIAGNOSIS — M7989 Other specified soft tissue disorders: Secondary | ICD-10-CM

## 2020-06-14 DIAGNOSIS — S96911A Strain of unspecified muscle and tendon at ankle and foot level, right foot, initial encounter: Secondary | ICD-10-CM

## 2020-06-14 DIAGNOSIS — M25571 Pain in right ankle and joints of right foot: Secondary | ICD-10-CM | POA: Diagnosis not present

## 2020-06-14 DIAGNOSIS — G5602 Carpal tunnel syndrome, left upper limb: Secondary | ICD-10-CM | POA: Diagnosis not present

## 2020-06-14 NOTE — Patient Instructions (Signed)
It was a pleasure to see you today!  1. For your carpal tunnel: try wearing the wrist braces at night and getting ergonomic brace for keyboard  2. For leg swelling: keep a journal and write down when you notice the swelling, what you did in the last day, and what you ate and drank in the last day.  3. For ankle pain: use heat, ice, voltaren gel, ankle exercises, tylenol, and ibuprofen  4. We will check some lab work today  5. I have referred you to diabetes nutrition who will call you to schedule  6. For your ringing in the ears: call Dr. Allene Pyo office to clarify cost before appt on 5/16  7. Follow up on 6/17 at 10:10 AM   Be Well,  Dr. Leary Roca

## 2020-06-14 NOTE — Progress Notes (Signed)
SUBJECTIVE:   CHIEF COMPLAINT / HPI:   Leg swelling: patient has remote history of bilateral leg swelling, present in the mornings. She had to get permission to wear slide shoes to work. This went away eventually on its own. Then she noticed on Easter she had leg swelling and felt that she couldn't wear her dress. She had to buy shoes that were a half size larger. The leg swelling was present in the morning, but got worse throughout the day. Her legs are not presently swollen.  L wrist, R ankle: Easter started to have pain in left ankle. 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 Wakes up at night and has to shake out wrists. Patient hasn't been walking or pedaling in a week for the pain. Other than rest has not used any other treatments.  DM: a1c 7.5% in December, will recheck today.  Tinnitus: still ongoing. Patient has approved visit with Dr. Ezzard Standing, ENT, upcoming on 5/16.  PERTINENT  PMH / PSH: DM  OBJECTIVE:   BP 108/60   Pulse 85   Ht 5\' 7"  (1.702 m)   Wt 219 lb 2 oz (99.4 kg)   SpO2 97%   BMI 34.32 kg/m   Nursing note and vitals reviewed GEN: age-appropriate AAW, resting comfortably in chair, NAD, WNWD HEENT: NCAT. PERRLA. Sclera without injection or icterus. MMM.  Neck: Supple.  Cardiac: Regular rate and rhythm. Normal S1/S2. No murmurs, rubs, or gallops appreciated. 2+ radial pulses. Lungs: Clear bilaterally to ascultation. No increased WOB, no accessory muscle usage. No w/r/r. L arm: Inspection yielded no erythema, ecchymosis, bony deformity, or swelling. ROM full with good flexion and extension and ulnar/radial deviation that is symmetrical with opposite wrist. Palpation is normal over metacarpals, scaphoid, lunate, and TFCC; tendons without tenderness/swelling. Strength 5/5 in all directions without pain. Negative Finkelstein, positive tinel's and phalen's. Neuro: Alert and at baseline Ankle/Foot, R: No visible erythema,  swelling, ecchymosis, or bony deformity. No notable pes planus/cavus deformity. Range of motion is full in all directions. Strength is 5/5 in all directions. No tenderness at the insertion/body/myotendinous junction of the Achilles tendon; No tenderness on posterior aspects of lateral and medial malleolus; Stable lateral and medial ligaments; Able to walk 4 steps.  Psych: Pleasant and appropriate  ASSESSMENT/PLAN:   Carpal tunnel syndrome Patient awakens at night with numbness and tingling in her three middle fingers. She works with computers throughout much of the day. Exam and hx c/w carpal tunnel. Recommend patient sleep with braces and get ergonomic wrist support for keyboard. Will start with conservative tx, if no improvement can send to [facility] for US/CSI.  Leg swelling Patient with unclear h/o leg swelling. It is not present all the time, but intermittently over several years. Patient is not clear on history, sometimes leg swelling present in the morning, sometimes only in evenings. Unclear associations with anything. Patient does not presently have any edema. Recommend patient keeps journal of symptoms, activities, and everything she ate and drank. Also recommend patient wear compression stockings if she has edema and follow up if it persists.  Ankle strain Patient with recent pain in ankle, no obvious signs of injury, but reported tenderness. Suspect musculoligamentous strain and will recommend conservative therapy, symptom treatment with RICE, follow up if no improvement.  Type 2 diabetes mellitus without complications (HCC) A1c 7.1%, continue current management. Patient is interested in nutrition education. Will refer for diabetic nutritionist.     [facility], MD [facility]  [place]

## 2020-06-15 LAB — URIC ACID: Uric Acid: 5.9 mg/dL (ref 3.0–7.2)

## 2020-06-15 LAB — HEMOGLOBIN A1C
Est. average glucose Bld gHb Est-mCnc: 157 mg/dL
Hgb A1c MFr Bld: 7.1 % — ABNORMAL HIGH (ref 4.8–5.6)

## 2020-06-20 DIAGNOSIS — G5600 Carpal tunnel syndrome, unspecified upper limb: Secondary | ICD-10-CM | POA: Insufficient documentation

## 2020-06-20 DIAGNOSIS — M7989 Other specified soft tissue disorders: Secondary | ICD-10-CM | POA: Insufficient documentation

## 2020-06-20 DIAGNOSIS — S96919A Strain of unspecified muscle and tendon at ankle and foot level, unspecified foot, initial encounter: Secondary | ICD-10-CM | POA: Insufficient documentation

## 2020-06-20 NOTE — Assessment & Plan Note (Signed)
Patient awakens at night with numbness and tingling in her three middle fingers. She works with computers throughout much of the day. Exam and hx c/w carpal tunnel. Recommend patient sleep with braces and get ergonomic wrist support for keyboard. Will start with conservative tx, if no improvement can send to [facility] for US/CSI.

## 2020-06-20 NOTE — Assessment & Plan Note (Signed)
A1c 7.1%, continue current management. Patient is interested in nutrition education. Will refer for diabetic nutritionist.

## 2020-06-20 NOTE — Assessment & Plan Note (Addendum)
Patient with unclear h/o leg swelling. It is not present all the time, but intermittently over several years. Patient is not clear on history, sometimes leg swelling present in the morning, sometimes only in evenings. Unclear associations with anything. Patient does not presently have any edema. Recommend patient keeps journal of symptoms, activities, and everything she ate and drank. Also recommend patient wear compression stockings if she has edema and follow up if it persists.

## 2020-06-20 NOTE — Assessment & Plan Note (Signed)
Patient with recent pain in ankle, no obvious signs of injury, but reported tenderness. Suspect musculoligamentous strain and will recommend conservative therapy, symptom treatment with RICE, follow up if no improvement.

## 2020-06-27 ENCOUNTER — Ambulatory Visit (INDEPENDENT_AMBULATORY_CARE_PROVIDER_SITE_OTHER): Payer: No Typology Code available for payment source | Admitting: Otolaryngology

## 2020-06-27 ENCOUNTER — Other Ambulatory Visit: Payer: Self-pay

## 2020-06-27 ENCOUNTER — Encounter (INDEPENDENT_AMBULATORY_CARE_PROVIDER_SITE_OTHER): Payer: Self-pay | Admitting: Otolaryngology

## 2020-06-27 VITALS — Temp 97.5°F

## 2020-06-27 DIAGNOSIS — H9311 Tinnitus, right ear: Secondary | ICD-10-CM | POA: Diagnosis not present

## 2020-06-27 DIAGNOSIS — H9120 Sudden idiopathic hearing loss, unspecified ear: Secondary | ICD-10-CM | POA: Diagnosis not present

## 2020-06-27 NOTE — Progress Notes (Signed)
HPI: Donna Cowan is a 55 y.o. female who presents is referred by her PCP for evaluation of complaints of ringing and pain in the right ear.  This apparently began back in March when she noticed hearing loss in the right ear as well as some balance problems.  She stated that initially she lost a lot of her hearing but more recently some of the hearing is returned.  She was also having worse balance problems when this initially began and her balance is getting better.  She apparently had an MRI scan performed that was normal. She now complains of some ringing in the right ear as well as pain in the ear and some pain or headaches on the right side of her head. Denies any loud noise exposure or trauma to the ear.  She does use a headset for ears for work over the past 3years. She was supposed to get an audiogram performed prior to visit here and this was not performed.  Unfortunately the audiology we have in this building does not take her insurance..  Past Medical History:  Diagnosis Date  . Back pain   . Chronic back pain   . Chronic neck pain   . Diabetes (HCC)   . Edema of both lower extremities   . Hypothyroidism   . Joint pain   . Lactose intolerance   . Sleep apnea   . Thyroid disease    Past Surgical History:  Procedure Laterality Date  . THYROID SURGERY    . TUBAL LIGATION     Social History   Socioeconomic History  . Marital status: Married    Spouse name: Mellody Dance  . Number of children: Not on file  . Years of education: Not on file  . Highest education level: Not on file  Occupational History  . Occupation: [employer] - work from home  Tobacco Use  . Smoking status: Former Games developer  . Smokeless tobacco: Never Used  Substance and Sexual Activity  . Alcohol use: Not on file  . Drug use: Never  . Sexual activity: Not on file  Other Topics Concern  . Not on file  Social History Narrative  . Not on file   Social Determinants of Health   Financial Resource  Strain: Not on file  Food Insecurity: Not on file  Transportation Needs: Not on file  Physical Activity: Not on file  Stress: Not on file  Social Connections: Not on file   Family History  Problem Relation Age of Onset  . Diabetes Mother   . High blood pressure Mother   . Stroke Mother   . Kidney disease Mother   . Sleep apnea Mother   . Obesity Mother   . Stroke Father    Allergies  Allergen Reactions  . Codeine    Prior to Admission medications   Medication Sig Start Date End Date Taking? Authorizing Provider  empagliflozin (JARDIANCE) 10 MG TABS tablet Take 1 tablet (10 mg total) by mouth daily before breakfast. 02/09/20   Shirlean Mylar, MD  levothyroxine (SYNTHROID) 137 MCG tablet Take 1 tablet (137 mcg total) by mouth daily before breakfast. 05/19/20   Shirlean Mylar, MD  tolnaftate (LOTRIMIN AF) 1 % powder Apply 1 application topically 2 (two) times daily. 02/09/20   Shirlean Mylar, MD  Vitamin D, Cholecalciferol, 25 MCG (1000 UT) TABS Take by mouth.    [provider]  Vitamin D, Ergocalciferol, (DRISDOL) 1.25 MG (50000 UNIT) CAPS capsule Take 1 capsule (50,000 Units  total) by mouth every 7 (seven) days. 10/13/19   Corinna Capra A, DO     Positive ROS: Otherwise negative  All other systems have been reviewed and were otherwise negative with the exception of those mentioned in the HPI and as above.  Physical Exam: Constitutional: Alert, well-appearing, no acute distress. Ears: External ears without lesions or tenderness. Ear canals are clear bilaterally.  Both TMs are clear on examination with no middle ear space abnormality noted.  On hearing screening with the 512 1024 tuning fork she had a mild right ear sensorineural hearing loss compared to the left. Nasal: External nose without lesions. Septum with minimal deformity and mild rhinitis. Clear nasal passages otherwise. Oral: Lips and gums without lesions. Tongue and palate mucosa without lesions. Posterior  oropharynx clear. Neck: No palpable adenopathy or masses.  There is no skin changes of the external ear or ear canal.  There is no rash over the skin of her scalp.  Although she complains of pain on the right side of her head. Respiratory: Breathing comfortably  Skin: No facial/neck lesions or rash noted.  Procedures  Assessment: On clinical exam findings are consistent with a right ear sudden sensorineural hearing loss with secondary vestibular abnormality.  This most likely is secondary to inner ear viral infection involving the cochlea as well as the vestibular nerves.  And the pain she is experiencing may be a secondary neuropathy.  Plan: I reviewed this with her. I did encourage her to get a hearing test to see how much hearing loss she has in the right ear. At this point I am not sure there is any medical therapy that will improve her symptoms. For the headache would recommend referral to neurology or headache specialist.   Narda Bonds, MD   CC:

## 2020-06-28 ENCOUNTER — Encounter: Payer: Self-pay | Admitting: Family Medicine

## 2020-06-29 ENCOUNTER — Encounter: Payer: Self-pay | Admitting: Family Medicine

## 2020-06-30 ENCOUNTER — Ambulatory Visit: Payer: No Typology Code available for payment source

## 2020-07-14 ENCOUNTER — Other Ambulatory Visit: Payer: Self-pay

## 2020-07-14 ENCOUNTER — Encounter: Payer: Self-pay | Admitting: Family Medicine

## 2020-07-14 ENCOUNTER — Ambulatory Visit (INDEPENDENT_AMBULATORY_CARE_PROVIDER_SITE_OTHER): Payer: No Typology Code available for payment source | Admitting: Family Medicine

## 2020-07-14 VITALS — BP 115/80 | HR 68 | Ht 67.0 in | Wt 224.2 lb

## 2020-07-14 DIAGNOSIS — R519 Headache, unspecified: Secondary | ICD-10-CM

## 2020-07-14 DIAGNOSIS — R2689 Other abnormalities of gait and mobility: Secondary | ICD-10-CM

## 2020-07-14 DIAGNOSIS — R209 Unspecified disturbances of skin sensation: Secondary | ICD-10-CM | POA: Diagnosis not present

## 2020-07-14 DIAGNOSIS — H9191 Unspecified hearing loss, right ear: Secondary | ICD-10-CM

## 2020-07-14 MED ORDER — PREDNISONE 20 MG PO TABS: 60.0000 mg | ORAL_TABLET | Freq: Every day | ORAL | 0 refills | Status: DC

## 2020-07-14 NOTE — Assessment & Plan Note (Signed)
Concern for giant cell temporal arteritis given temporal headache, right sided jaw pain. Collecting CBC with diff, ESR, CRP today. Will place urgent referral to general surgery for temporal artery biopsy. Spoke with [facility], they are out of network for this patient. Will talk with our referral coordinator to see if another surgical group could take her for biopsy. Starting steroids (prednisone 60 mg daily) tomorrow morning. Close follow up, appointment made for Tues 6/7.

## 2020-07-14 NOTE — Progress Notes (Signed)
    SUBJECTIVE:   CHIEF COMPLAINT / HPI:   Right sided headache - several month h/o right sided temporal headache now worsening - also right ear pain, decreased hearing on right, right jaw pain, right neck pain - balance issues and tinnitus - all began several months ago but have been worsening - now dull right sided headache all day every day with periods of worsened HA - tender to palpation over right: temple, pre-auricular area, right TMJ - seen by ENT, unsure of dx, no tx offered, recommended neuro referral  - also having blood from mouth in mornings (separate from blood when brushing teeth)  PERTINENT  PMH / PSH: T2DM, Graves disease s/p ablation, class 1 obesity  OBJECTIVE:   BP 115/80   Pulse 68   Ht $R'5\' 7"'Ak$  (1.702 m)   Wt 224 lb 3.2 oz (101.7 kg)   SpO2 98%   BMI 35.11 kg/m    PHQ-9:  Depression screen [facility] 2/9 07/14/2020 06/14/2020 05/18/2020  Decreased Interest 1 0 1  Down, Depressed, Hopeless 0 1 0  PHQ - 2 Score $Remov'1 1 1  'mKerSN$ Altered sleeping $RemoveBeforeDE'1 2 1  'ONmYnddznjtKesB$ Tired, decreased energy $RemoveBeforeDE'1 1 1  'qfmPLZxarfqaNSe$ Change in appetite - 1 0  Feeling bad or failure about yourself  0 0 0  Trouble concentrating 0 0 2  Moving slowly or fidgety/restless 0 1 0  Suicidal thoughts 0 0 0  PHQ-9 Score $RemoveBef'3 6 5  'noRUootyNE$ Difficult doing work/chores - - -     GAD-7: No flowsheet data found.   Physical Exam General: Awake, alert, oriented Cardiovascular: Regular rate and rhythm, S1 and S2 present, no murmurs auscultated Respiratory: Lung fields clear to auscultation bilaterally Extremities: No bilateral lower extremity edema, palpable pedal and pretibial pulses bilaterally Neuro: Cranial nerves II through X grossly intact, able to move all extremities spontaneously, sensation to soft and sharp intact   ASSESSMENT/PLAN:   Right sided temporal headache Concern for giant cell temporal arteritis given temporal headache, right sided jaw pain. Collecting CBC with diff, ESR, CRP today. Will place urgent referral to general surgery  for temporal artery biopsy. Spoke with [facility], they are out of network for this patient. Will talk with our referral coordinator to see if another surgical group could take her for biopsy. Starting steroids (prednisone 60 mg daily) tomorrow morning. Close follow up, appointment made for Tues 6/7.      Ezequiel Essex, MD [place]

## 2020-07-15 ENCOUNTER — Telehealth: Payer: Self-pay

## 2020-07-15 ENCOUNTER — Ambulatory Visit (INDEPENDENT_AMBULATORY_CARE_PROVIDER_SITE_OTHER): Payer: No Typology Code available for payment source | Admitting: Vascular Surgery

## 2020-07-15 ENCOUNTER — Telehealth: Payer: Self-pay | Admitting: Family Medicine

## 2020-07-15 ENCOUNTER — Encounter: Payer: Self-pay | Admitting: Vascular Surgery

## 2020-07-15 ENCOUNTER — Other Ambulatory Visit: Payer: Self-pay

## 2020-07-15 VITALS — BP 133/87 | HR 65 | Temp 97.8°F | Resp 20 | Ht 67.0 in | Wt 221.0 lb

## 2020-07-15 DIAGNOSIS — M316 Other giant cell arteritis: Secondary | ICD-10-CM | POA: Diagnosis not present

## 2020-07-15 LAB — CBC WITH DIFFERENTIAL/PLATELET
Basophils Absolute: 0.1 10*3/uL (ref 0.0–0.2)
Basos: 1 %
EOS (ABSOLUTE): 0.1 10*3/uL (ref 0.0–0.4)
Eos: 2 %
Hematocrit: 42.2 % (ref 34.0–46.6)
Hemoglobin: 13.9 g/dL (ref 11.1–15.9)
Immature Grans (Abs): 0 10*3/uL (ref 0.0–0.1)
Immature Granulocytes: 0 %
Lymphocytes Absolute: 2.2 10*3/uL (ref 0.7–3.1)
Lymphs: 36 %
MCH: 29.9 pg (ref 26.6–33.0)
MCHC: 32.9 g/dL (ref 31.5–35.7)
MCV: 91 fL (ref 79–97)
Monocytes Absolute: 0.6 10*3/uL (ref 0.1–0.9)
Monocytes: 10 %
Neutrophils Absolute: 3.1 10*3/uL (ref 1.4–7.0)
Neutrophils: 51 %
Platelets: 241 10*3/uL (ref 150–450)
RBC: 4.65 x10E6/uL (ref 3.77–5.28)
RDW: 13.7 % (ref 11.7–15.4)
WBC: 6 10*3/uL (ref 3.4–10.8)

## 2020-07-15 LAB — C-REACTIVE PROTEIN: CRP: 6 mg/L (ref 0–10)

## 2020-07-15 LAB — SEDIMENTATION RATE: Sed Rate: 4 mm/hr (ref 0–40)

## 2020-07-15 NOTE — Progress Notes (Signed)
Patient ID: Donna Cowan, female   DOB: [birth year], 55 y.o.   MRN: [id number]  Reason for Consult: New Patient (Initial Visit)   Referred by Donna Damme, MD  Subjective:     HPI:  Donna Cowan is a 55 y.o. female history of right face and ear pain.  Recently she has also had headaches.  She was evaluated by her primary care doctor yesterday started on antibiotics for concern of giant cell arteritis.  She does not have any new visual changes but does have history of thyroid eye disease.  No previous or family history of giant cell arteritis.  Past Medical History:  Diagnosis Date  . Back pain   . Chronic back pain   . Chronic neck pain   . Diabetes ([place])   . Edema of both lower extremities   . Hypothyroidism   . Joint pain   . Lactose intolerance   . Sleep apnea   . Thyroid disease    Family History  Problem Relation Age of Onset  . Diabetes Mother   . High blood pressure Mother   . Stroke Mother   . Kidney disease Mother   . Sleep apnea Mother   . Obesity Mother   . Stroke Father    Past Surgical History:  Procedure Laterality Date  . THYROID SURGERY    . TUBAL LIGATION      Short Social History:  Social History   Tobacco Use  . Smoking status: Former Research scientist (life sciences)  . Smokeless tobacco: Never Used  Substance Use Topics  . Alcohol use: Not on file    Allergies  Allergen Reactions  . Codeine     Current Outpatient Medications  Medication Sig Dispense Refill  . empagliflozin (JARDIANCE) 10 MG TABS tablet Take 1 tablet (10 mg total) by mouth daily before breakfast. 30 tablet 0  . levothyroxine (SYNTHROID) 137 MCG tablet Take 1 tablet (137 mcg total) by mouth daily before breakfast. 90 tablet 0  . predniSONE (DELTASONE) 20 MG tablet Take 3 tablets (60 mg total) by mouth daily with breakfast. 42 tablet 0  . tolnaftate (LOTRIMIN AF) 1 % powder Apply 1 application topically 2 (two) times daily. 45 g 0   No current facility-administered medications for this  visit.    Review of Systems  Constitutional:  Constitutional negative. HENT:       Right ear pain Eyes: Eyes negative.  Cardiovascular: Cardiovascular negative.  GI: Gastrointestinal negative.  Musculoskeletal: Musculoskeletal negative.  Skin: Skin negative.  Neurological: Positive for headaches.  Hematologic: Hematologic/lymphatic negative.  Psychiatric: Psychiatric negative.        Objective:  Objective   Vitals:   07/15/20 1442  BP: 133/87  Pulse: 65  Resp: 20  Temp: 97.8 F (36.6 C)  SpO2: 96%  Weight: 221 lb (100.2 kg)  Height: 5' 7"  (1.702 m)   Body mass index is 34.61 kg/m.  Physical Exam HENT:     Head: Normocephalic.     Nose:     Comments: Wearing a mask Eyes:     Pupils: Pupils are equal, round, and reactive to light.  Cardiovascular:     Rate and Rhythm: Normal rate.     Pulses: Normal pulses.  Abdominal:     General: Abdomen is flat.     Palpations: Abdomen is soft.  Musculoskeletal:        General: No swelling. Normal range of motion.     Cervical back: Normal range of motion.  Skin:  General: Skin is warm and dry.     Capillary Refill: Capillary refill takes less than 2 seconds.  Neurological:     Mental Status: She is alert.  Psychiatric:        Mood and Affect: Mood normal.        Behavior: Behavior normal.        Thought Content: Thought content normal.        Judgment: Judgment normal.     Data: CRP 6 ESR 4     Assessment/Plan:     55 year old female with primary care concern for giant cell arteritis.  I discussed with her the nature of temporal artery biopsy as well as the risk and benefits.  I have recommended proceeding within the next week if we are going to perform biopsy given that she has been started on steroids.  She understands that this will be performed by one of my partners.  She is going to schedule today and pray about it over the weekend possibly will call to cancel if she does not feel like proceeding at that  time.     Donna Sandy MD [facility]

## 2020-07-15 NOTE — Telephone Encounter (Signed)
Received message from Dr. Larita Fife to cancel pts temporal artery bx surgery for 07/18/20 due to labs ruled out medical condition.

## 2020-07-15 NOTE — Telephone Encounter (Signed)
Spoke with patient regarding labs, surgery scheduling for biopsy, and concern for giant cell temporal arteritis.   Given that CRP and ESR are completely normal, this essentially rules out giant cell temporal arteritis.   Relayed this to the patient. She will call the surgery office to cancel the biopsy. Also instructed her to discontinue the steroid course.   Will see her at follow up appointment on Tuesday.   Ezequiel Essex, MD

## 2020-07-18 ENCOUNTER — Ambulatory Visit: Admit: 2020-07-18 | Payer: No Typology Code available for payment source | Admitting: Vascular Surgery

## 2020-07-18 ENCOUNTER — Other Ambulatory Visit: Payer: Self-pay | Admitting: Family Medicine

## 2020-07-18 SURGERY — BIOPSY TEMPORAL ARTERY
Anesthesia: Choice

## 2020-07-18 NOTE — Progress Notes (Signed)
    SUBJECTIVE:   CHIEF COMPLAINT / HPI:   Right Temporal Headache f/u Patient presents today for follow up on right temporal headache. Has been present for about 2 months. Thus far has had negative MRI, CRP/ESR, CBC within normal limits. Has seen ENT previously and told she may have an inner ear viral infection. Patient has tried Alleve without relief. 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 Has not had a lot of success in relieving headache. Associated symptoms include right eye swelling and tearing.   PERTINENT  PMH / PSH:  Past Medical History:  Diagnosis Date  . Back pain   . Chronic back pain   . Chronic neck pain   . Diabetes ([place])   . Edema of both lower extremities   . Hypothyroidism   . Joint pain   . Lactose intolerance   . Sleep apnea   . Thyroid disease      OBJECTIVE:   BP 126/82   Pulse 60   Wt 220 lb (99.8 kg)   SpO2 97%   BMI 34.46 kg/m    General: NAD, pleasant, able to participate in exam HEENT: Normocephalic, right eye appears slightly edematous when compared to left, slightly more conjunctival injection in right eye as well, mild tenderness to right-side of face anterior to ear without visual abnormalities or masses felt, TM's clear without bulging or erythema Back: Tense musculature in upper back without tenderness to palpation Neuro: CN 2-12 intact, speech is clear without slurring, able to follow commands appropriately, normal gait Psych: Normal affect and mood  ASSESSMENT/PLAN:   Chronic right-sided 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 Reassuringly neuro exam was normal today. Discussed indomethacin as treatment and trial to see if this is the cause of her headache. Patient is understandably frustrated that we do not know for sure the cause of her symptoms at this time. We discussed potential triggers including increased stress and  her work involving constant lights and sounds (through headphones) which can exacerbate her symptoms. Patient is amenable to trying Indomethacin. She has an appointment scheduled with her PCP 6/17 which would be right after her 9-day course of Indomethacin.  -F/u on status of headache after indomethacin -Consider Topamax     Sharion Settler, [place]

## 2020-07-19 ENCOUNTER — Ambulatory Visit (INDEPENDENT_AMBULATORY_CARE_PROVIDER_SITE_OTHER): Payer: No Typology Code available for payment source | Admitting: Family Medicine

## 2020-07-19 ENCOUNTER — Other Ambulatory Visit: Payer: Self-pay

## 2020-07-19 ENCOUNTER — Encounter: Payer: Self-pay | Admitting: Family Medicine

## 2020-07-19 VITALS — BP 126/82 | HR 60 | Wt 220.0 lb

## 2020-07-19 DIAGNOSIS — G4451 Hemicrania continua: Secondary | ICD-10-CM | POA: Diagnosis not present

## 2020-07-19 DIAGNOSIS — R519 Headache, unspecified: Secondary | ICD-10-CM | POA: Insufficient documentation

## 2020-07-19 DIAGNOSIS — G8929 Other chronic pain: Secondary | ICD-10-CM

## 2020-07-19 MED ORDER — INDOMETHACIN 25 MG PO CAPS: ORAL_CAPSULE | ORAL | 0 refills | Status: DC

## 2020-07-19 NOTE — Assessment & Plan Note (Addendum)
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 Reassuringly neuro exam was normal today. Discussed indomethacin as treatment and trial to see if this is the cause of her headache. Patient is understandably frustrated that we do not know for sure the cause of her symptoms at this time. We discussed potential triggers including increased stress and her work involving constant lights and sounds (through headphones) which can exacerbate her symptoms. Patient is amenable to trying Indomethacin. She has an appointment scheduled with her PCP 6/17 which would be right after her 9-day course of Indomethacin.  -F/u on status of headache after indomethacin -Consider Topamax

## 2020-07-19 NOTE — Patient Instructions (Addendum)
It was wonderful to see you today.  Please bring ALL of your medications with you to every visit.   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 More information on this is below. I have sent in a prescription for Indomethacin. You will need to take 25 mg for 3 days, then 50 mg for 3 days and finally 75 mg for 3 days. You have a follow up with Dr. Leary Roca scheduled for 6/17.   I am really sorry that you are experiencing this discomfort. I can only imagine your frustration. I sincerely hope that this helps and that we will be able to help you through this.   Thank you for choosing [facility].   Please call [phone] with any questions about today's appointment.  Please be sure to schedule follow up at the front  desk before you leave today.   Sabino Dick, DO PGY-1 Family Medicine    Indomethacin-Responsive Headache, Adult An indomethacin-responsive headache is a headache that gets better when you take indomethacin. Indomethacin is a kind of NSAID (nonsteroidal anti-inflammatory drug). Indomethacin can quickly stop the pain from some kinds of headaches, such as:  Paroxysmal hemicrania. This is a series of short, severe headaches, usually on just one side of the head.  Hemicrania continua. Pain is nonstop and on one side of the face.  Primary exertional headache. Exercise sets off these headaches.  Primary cough headache. Pain may come from pressure in the brain when coughing or straining. What are the causes? The exact cause of this condition is not known. Certain things may start (trigger) a headache. They include:  Moving the head in certain ways.  Stress.  Pressure on sensitive areas of the neck.  Drinking alcohol.  Exercise.  Coughing and sneezing. What increases the risk? The following factors may make you more likely to develop this condition:  Being 61 years of age or older.  Having a  serious head injury.  Having migraine headaches.  Having a family history of this condition. What are the signs or symptoms? Symptoms of this condition depend on the kind of headache you have.  Paroxysmal hemicrania: ? Having about 10 headaches a day. Each may last from a few minutes to 2 hours. ? Severe, pounding pain. ? Pain usually on just one side of the head. It often centers around the eye or in the forehead. ? A watery eye, which may become red or swollen. ? A droopy or swollen eyelid. ? Sweating and having a red or pinkish face. ? A stuffy, runny nose.  Hemicrania continua: ? All-day headache. This may occur daily for at least 3 months. Then there may be no headaches for weeks or months. ? Pain that gets worse several times during the day. ? Pain in the face, on one side only. It almost always occurs on the same side. ? A watery eye. It may also become droopy, red, and swollen. ? A stuffy, runny nose. ? Pain that gets worse with sound or light.  Primary exertional headache: ? Pain during physical activity. ? Pounding or throbbing pain. ? Pain that lasts from 5 minutes to 48 hours, or sometimes longer.  Primary cough headache: ? Pain that starts after coughing, sneezing, or straining. ? Sharp, stabbing pain. ? Pain on both sides of the head. It is often worse in the back of the head. ? Pain that is severe for a few minutes and then dull for  several hours. How is this diagnosed? This condition may be diagnosed based on:  Symptoms and medical history. Your health care provider will ask you questions about your headaches.  Physical exam.  Tests. These may include: ? Blood and urine tests. ? Spinal tap (lumbar puncture). This tests a sample of fluid from your spine. The test checks for infection, bleeding in your brain (brain hemorrhage), or extra pressure inside your skull. ? Ultrasound, MRI, CT scan, or other imaging tests. If no medical condition is causing your  headaches, you will be given indomethacin. If yours is an indomethacin-responsive headache, your symptoms should go away quickly. How is this treated? Treatment may include:  Taking indomethacin.  Using other medicines: ? To prevent or treat stomach ulcers. ? To relieve stomachache or heartburn (antacids). ? For nausea. Follow these instructions at home: Lifestyle  Rest in a dark, quiet room.  Put a cool, damp washcloth on your head or face.  Get plenty of sleep. Most adults should get at least 7-9 hours of sleep each night.  Eat on a regular schedule. Do not skip meals.  Do not drink alcohol.  Do not use any products that contain nicotine or tobacco. These products include cigarettes, chewing tobacco, and vaping devices, such as e-cigarettes. If you need help quitting, ask your health care provider. Headache diary Keep a headache diary. This will help you and your health care provider determine what is triggering your headaches. Each time you have a headache, write down:  When it started and stopped. Include the day and time.  How it felt.  Any triggers, such as noise, stress, or foods.  Any medicines you took.   General instructions  Take over-the-counter and prescription medicines only as told by your health care provider. ? Do not take other NSAIDs, such as ibuprofen, with indomethacin. ? Tell your health care provider about all medicines you are taking, including vitamins, herbs, eye drops, creams, and over-the-counter medicines.  Keep all follow-up visits. This is important. Contact a health care provider if:  Your pain continues even with treatment.  You have nausea.  You have a fever. Get help right away if:  You have bad stomach pain or vomiting.  You vomit blood.  You have blood in your stool.  You have chest pain.  You have any symptoms of a stroke. "BE FAST" is an easy way to remember the main warning signs of a stroke: ? B - Balance. Signs are  dizziness, sudden trouble walking, or loss of balance. ? E - Eyes. Signs are trouble seeing or a sudden change in vision. ? F - Face. Signs are sudden weakness or numbness of the face, or the face or eyelid drooping on one side. ? A - Arms. Signs are weakness or numbness in an arm. This happens suddenly and usually on one side of the body. ? S - Speech. Signs are sudden trouble speaking, slurred speech, or trouble understanding what people say. ? T - Time. Time to call emergency services. Write down what time symptoms started.  You have other signs of a stroke, such as: ? A sudden, severe headache with no known cause. ? Nausea or vomiting. ? Seizure. These symptoms may represent a serious problem that is an emergency. Do not wait to see if the symptoms will go away. Get medical help right away. Call your local emergency services (911 in the [place] Do not drive yourself to the hospital. Summary  An indomethacin-responsive headache is a headache  that gets better when you take indomethacin, a kind of NSAID.  The exact cause of this condition is not known, but there are certain things that may start (trigger) a headache.  Keep a headache diary to help your health care provider determine your triggers.  Treatment of this condition includes indomethacin, but it may include other medicines to relieve other symptoms. This information is not intended to replace advice given to you by your health care provider. Make sure you discuss any questions you have with your health care provider. Document Revised: 11/30/2019 Document Reviewed: 11/30/2019 Elsevier Patient Education  2021 [employer].

## 2020-07-28 NOTE — Progress Notes (Signed)
    SUBJECTIVE:   CHIEF COMPLAINT / HPI: f/u headache/tinnitus  Right sided HA and tinnitus: Patient reports today that her pain is still present after indomethacin trial. She had tinnitus start in her left ear and had worsening generalized headache with indomethacin, stopped after day 5. 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 Failure of indomethacin trial makes hemicrania continua very unlikely. Her pain today is in a distribution across her right cheek just anterior to her right ear and it aches into her forehead and jaw. She also has pain behind her right eye. Last ophthalmology exam was earlier this year with out abnormalities. Pain is present continuously, but may wax and wane in intensity. She also continues to have right sided tinnitus. Patient is understandably very frustrated with chronic pain. This is affecting her ability to function and do her job as she finds it difficult to focus and concentrate with pain and ringing.  Health care maintenance: Mammogram- ordered, reminded to do pap this year (patient in pain today). TSH in April WNL.  PERTINENT  PMH / PSH: DM2, hypothyroidism  OBJECTIVE:   BP 138/82   Pulse 66   Ht $R'5\' 7"'Qx$  (1.702 m)   Wt 223 lb (101.2 kg)   SpO2 98%   BMI 34.93 kg/m   Nursing note and vitals reviewed GEN: age-appropriate AAW, resting comfortably in chair, NAD, WNWD HEENT: NCAT. Exophthalmos. Sclera without injection or icterus. MMM.  Neck: Supple.  Cardiac: Regular rate and rhythm. Normal S1/S2. No murmurs, rubs, or gallops appreciated. 2+ radial pulses. Neuro: Cranial Nerves II: PERRL.  III,IV, VI: EOMI without ptosis or diplopia.  V: Facial sensation is symmetric to touch VII: Facial movement is symmetric.  VIII: Hearing is intact to voice X: Palate elevates symmetrically XI: Shoulder shrug is symmetric. XII:  Tongue is midline without atrophy or fasciculations.  Motor: Tone is normal. Bulk is normal. 5/5 strength was present in all four extremities.  Sensory: Sensation is symmetric to light touch in the arms and legs. Deep Tendon Reflexes: 2+ and symmetric in the biceps and patellae.  Ext: no edema Psych: Pleasant and appropriate, though frustrated  ASSESSMENT/PLAN:   Chronic right-sided headache Patient with intractable right-sided facial headache. Concern for trigeminal neuralgia. Extensive work up done (see above). Patient is understandably frustrated and very uncomfortable. Discussed options to trial treatment like topiramate, carbamazepine, however signifcant SEs and dx is unclear. Pain appears to be nerve-mediated, will trial gabapentin to see if we cannot achieve some relief. I had previously discussed with her referral to neurology, but at that time we were pursuing ENT as well and she wanted to wait. Now with 2 months of headache, will place referral for neuro, headache clinic if that is an option. Follow up closely in 2-3 weeks to titrate gabapentin. - Gabapentin 100 mg TID. Increase by 100 mg every 3 days.     Gladys Damme, MD [place]

## 2020-07-29 ENCOUNTER — Telehealth: Payer: Self-pay

## 2020-07-29 ENCOUNTER — Encounter: Payer: Self-pay | Admitting: Family Medicine

## 2020-07-29 ENCOUNTER — Other Ambulatory Visit: Payer: Self-pay

## 2020-07-29 ENCOUNTER — Ambulatory Visit (INDEPENDENT_AMBULATORY_CARE_PROVIDER_SITE_OTHER): Payer: No Typology Code available for payment source | Admitting: Family Medicine

## 2020-07-29 VITALS — BP 138/82 | HR 66 | Ht 67.0 in | Wt 223.0 lb

## 2020-07-29 DIAGNOSIS — G8929 Other chronic pain: Secondary | ICD-10-CM | POA: Diagnosis not present

## 2020-07-29 DIAGNOSIS — R519 Headache, unspecified: Secondary | ICD-10-CM | POA: Diagnosis not present

## 2020-07-29 DIAGNOSIS — Z1231 Encounter for screening mammogram for malignant neoplasm of breast: Secondary | ICD-10-CM

## 2020-07-29 MED ORDER — GABAPENTIN 100 MG PO CAPS: 100.0000 mg | ORAL_CAPSULE | Freq: Three times a day (TID) | ORAL | 1 refills | Status: DC

## 2020-07-29 NOTE — Patient Instructions (Addendum)
It was a pleasure to see you today!  For headache pain: try gabapentin 100 mg three times a day. Every three days increase by 100 mg. Goal is to reduce your headache pain. Follow up with me in 3-4 weeks, or sooner if worse. I have placed a referral for neurology. You should receive a phone call in 1-2 weeks to schedule this appointment. If for any reason you do not receive a phone call or need more help scheduling this appointment, please call our office at [phone]. Keep a journal of  our swelling and pain symptoms in your arms and legs. Write down when you notice it, what you ate and drank, and what you were doing when the pain/swelling occurred.  Be Well,  Dr. Leary Roca

## 2020-07-29 NOTE — Assessment & Plan Note (Signed)
Patient with intractable right-sided facial headache. Concern for trigeminal neuralgia. Extensive work up done (see above). Patient is understandably frustrated and very uncomfortable. Discussed options to trial treatment like topiramate, carbamazepine, however signifcant SEs and dx is unclear. Pain appears to be nerve-mediated, will trial gabapentin to see if we cannot achieve some relief. I had previously discussed with her referral to neurology, but at that time we were pursuing ENT as well and she wanted to wait. Now with 2 months of headache, will place referral for neuro, headache clinic if that is an option. Follow up closely in 2-3 weeks to titrate gabapentin. - Gabapentin 100 mg TID. Increase by 100 mg every 3 days.

## 2020-07-29 NOTE — Telephone Encounter (Signed)
Patient calls nurse line regarding COVID exposure over the weekend of 07/23/2020. Patient reports that as of right now, she is completely asymptomatic. Patient has been vaccinated for COVID but has not yet received booster.   Advised to test if she became symptomatic, to wear mask when around others, social distance and practice good hand hygiene.   Patient will call office in a few weeks to schedule booster for herself and spouse.   Veronda Prude, RN

## 2020-08-01 ENCOUNTER — Encounter: Payer: Self-pay | Admitting: Family Medicine

## 2020-08-19 ENCOUNTER — Other Ambulatory Visit: Payer: Self-pay

## 2020-08-19 ENCOUNTER — Encounter: Payer: PRIVATE HEALTH INSURANCE | Attending: Sports Medicine | Admitting: Dietician

## 2020-08-19 DIAGNOSIS — E119 Type 2 diabetes mellitus without complications: Secondary | ICD-10-CM | POA: Diagnosis present

## 2020-08-19 NOTE — Patient Instructions (Addendum)
Eat regularly balanced meals.  Before eating a snack or additional meal portions, ask, "Am I hungry or am I eating for a different reason?"  What could you do instead of eating out of emotion, stress, boredom etc.  What habits may not be the best to start?   Recommend keeping food in the kitchen and continue to eat in the kitchen. Consider returning to a small plate.   Consistent exercise- aim for 150 minutes spread throughout the week (30 minutes 5 days per week)  Podcast when walking or biking   TV when biking

## 2020-08-19 NOTE — Progress Notes (Signed)
Medical Nutrition Therapy  Appointment Start time:  1300  Appointment End time:  1520  Primary concerns today: She would like to learn more about eating.  She would like to have a good quality of life and not be ruled by illness.  She states that she has an increased weakness for the foods that her husband brings into her home. She would like more recipe ideas.  She states that she likes fish.   Emotional eater (at one point ate a whole pie).   Referral diagnosis: Type 2 Diabetes Preferred learning style: no preference indicated Learning readiness: ready   NUTRITION ASSESSMENT   Anthropometrics  67" 223 lbs 07/29/2020 194 lbs 03/2019   Clinical Medical Hx: Type 2 diabetes, edema lower extremities, Grave's disease, lactose intolerance, sleep apnea (not using c-pap), joint pain, chronic headache since 05/2020 Medications: see list to include Jardiance (as needed), and vitamin D Labs: A1C 7.1% 06/14/2020 decreased from 7.5% 02/08/2021, vitamin D 22 and eGFR 62 08/13/2019  Lifestyle & Dietary Hx Patient lives with her husband.  Patient does the majority of the shopping and cooking.  Her husband has diabetes and HTN and buys increased amounts of cookies cookies, and lunch meat and snacks at times.  Patient buys increased fruits and vegetables, tuna and healthy foods but feels challenged by her husband.  He gets mad when she cooks a meatless meal.  He is 55 yo, fell last week at work and hurt his leg.  She tried to get him to come to this appointment.  They now have an air fryer.  She eats ice cream on date night and gets upset when her husband brings this into the home.  Last year she stated that she ate more due to grief of her mother dying 02/2019.  She is now trying to break this. 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  She does get 2 15 minute breaks and a meal break.  She does have a separate office.   She believes that she should eat in the kitchen or dining room but has been bringing snacks to  her office at times. She was eating on a smaller plate last year and states that she should go back to this. Cooks mostly at home.      Supplements: 1000 units vitamin D daily Sleep: 7 Stress / self-care: "tries not to" - believes in God and gives it to them Current average weekly physical activity: Group of friends encourage her to walk.  She walked 4 miles last Saturday and walks this group each week. She is aiming to walk weekly on the trail at the park.  She has an exercise bike and is aiming for 2 times per week for 1-2 hours but is doing this weekly.  Her treadmill broke and prefers this.  24-Hr Dietary Recall First Meal: 2 boiled egg whites, fresh fruit, hot tea OR weekends- grits or rare home fries, eggs, sausage, toast or homemade muffin, fruit Snack: occasional trail mix (nuts and dried fruit) Second Meal: chicken salad - homemade with less mayo (sometimes with bread or crackers) or tuna with small amount mayo and mustard OR salad with chicken or tuna or beans OR Progresso soup AND fresh fruit Snack: similar to am Third Meal: meat, starch, vegetable OR spaghetti, salad OR occasional [place] Snack: none Beverages: hot tea (unsweetened or with 2 drops agave), water with basal or lemon or cucumber  NUTRITION DIAGNOSIS  NB-1.1 Food and nutrition-related knowledge deficit As related to  balance of carbohydrate, protein, and fat.  As evidenced by diet hx and patient report.   NUTRITION INTERVENTION  Nutrition education (E-1) on the following topics:  Mindful eating - eating in the kitchen.   Discussion of emotional eating and tips Habit formation Tips for increasing activity Portion control/my plate What foods affect the blood glucose Recipe references and ideas   Handouts Provided Include  How to Thrive:  A guide for your journey with diabetes from ADA Meal plan card Diabetes resources  Learning Style & Readiness for Change Teaching method utilized: Visual & Auditory   Demonstrated degree of understanding via: Teach Back  Barriers to learning/adherence to lifestyle change: spouse  Goals Established by Pt Increase exercise Eat in the kitchen Consider options rather than emotional eating.  Eat regularly balanced meals.  Before eating a snack or additional meal portions, ask, "Am I hungry or am I eating for a different reason?"  What could you do instead of eating out of emotion, stress, boredom etc.  What habits may not be the best to start?   Recommend keeping food in the kitchen and continue to eat in the kitchen. Consider returning to a small plate.   Consistent exercise- aim for 150 minutes spread throughout the week (30 minutes 5 days per week)  Podcast when walking or biking   TV when biking   MONITORING & EVALUATION Dietary intake, weekly physical activity, and label reading in 2 months.  Next Steps  Patient is to call for questions.

## 2020-08-25 ENCOUNTER — Encounter: Payer: Self-pay | Admitting: Family Medicine

## 2020-08-25 DIAGNOSIS — E038 Other specified hypothyroidism: Secondary | ICD-10-CM

## 2020-08-25 MED ORDER — LEVOTHYROXINE SODIUM 137 MCG PO TABS: 137.0000 ug | ORAL_TABLET | Freq: Every day | ORAL | 3 refills | Status: DC

## 2020-09-10 ENCOUNTER — Encounter: Payer: Self-pay | Admitting: Nurse Practitioner

## 2020-09-10 ENCOUNTER — Telehealth: Payer: No Typology Code available for payment source | Admitting: Nurse Practitioner

## 2020-09-10 DIAGNOSIS — R52 Pain, unspecified: Secondary | ICD-10-CM | POA: Diagnosis not present

## 2020-09-10 DIAGNOSIS — U071 COVID-19: Secondary | ICD-10-CM | POA: Diagnosis not present

## 2020-09-10 MED ORDER — MOLNUPIRAVIR EUA 200MG CAPSULE: 4.0000 | ORAL_CAPSULE | Freq: Two times a day (BID) | ORAL | 0 refills | Status: AC

## 2020-09-10 MED ORDER — BINAXNOW COVID-19 AG HOME TEST VI KIT: PACK | 1 refills | Status: DC

## 2020-09-10 NOTE — Progress Notes (Signed)
Virtual Visit Consent   Donna Cowan, you are scheduled for a virtual visit with a [facility] provider today.     Just as with appointments in the office, your consent must be obtained to participate.  Your consent will be active for this visit and any virtual visit you may have with one of our providers in the next 365 days.     If you have a MyChart account, a copy of this consent can be sent to you electronically.  All virtual visits are billed to your insurance company just like a traditional visit in the office.    As this is a virtual visit, video technology does not allow for your provider to perform a traditional examination.  This may limit your provider's ability to fully assess your condition.  If your provider identifies any concerns that need to be evaluated in person or the need to arrange testing (such as labs, EKG, etc.), we will make arrangements to do so.     Although advances in technology are sophisticated, we cannot ensure that it will always work on either your end or our end.  If the connection with a video visit is poor, the visit may have to be switched to a telephone visit.  With either a video or telephone visit, we are not always able to ensure that we have a secure connection.     I need to obtain your verbal consent now.   Are you willing to proceed with your visit today?    Sharai Overbay has provided verbal consent on 09/10/2020 for a virtual visit (video or telephone).   Apolonio Schneiders, FNP   Date: 09/10/2020 10:36 AM   Virtual Visit via Video Note   I, Apolonio Schneiders, connected with  Alydia Gosser  ([id number], 1965-04-30) on 09/10/20 at 10:45 AM EDT by a video-enabled telemedicine application and verified that I am speaking with the correct person using two identifiers.  Location: Patient: Virtual Visit Location Patient: Home Provider: Virtual Visit Location Provider: Office/Clinic   I discussed the limitations of evaluation and management by  telemedicine and the availability of in person appointments. The patient expressed understanding and agreed to proceed.    History of Present Illness: Donna Cowan is a 55 y.o. who identifies as a female who was assigned female at birth, and is being seen today with complaints of headache, body aches and a cough for the past 3 days. She has had a decreased appetite.   She did have a little nasal congestion and eye irritation earlier in the week that she contributed to allergies.   She has been vaccinated for COVID X2 no booster   Patient was able to get at home test and tested positive today.  Has not had recent CMP/BMP performed    Problems:  Patient Active Problem List   Diagnosis Date Noted   Chronic right-sided headache 07/19/2020   Right sided temporal headache 07/14/2020   Carpal tunnel syndrome 06/20/2020   Leg swelling 06/20/2020   Ankle strain 06/20/2020   Tinnitus of right ear 05/19/2020   Class 1 obesity due to excess calories with body mass index (BMI) of 31.0 to 31.9 in adult 08/12/2019   Bereavement reaction 03/29/2019   Type 2 diabetes mellitus without complications ([place]) 03/47/4259   Graves disease 03/29/2019   Healthcare maintenance 03/29/2019   Vitamin D deficiency 03/29/2019   Back injury, sequela 03/29/2019    Allergies:  Allergies  Allergen Reactions   Codeine    Current  Outpatient Medications  Medication Instructions   cholecalciferol (VITAMIN D3) 1,000 Units, Oral, Daily   empagliflozin (JARDIANCE) 10 mg, Oral, Daily before breakfast   levothyroxine (SYNTHROID) 137 mcg, Oral, Daily before breakfast     Observations/Objective: Patient is well-developed, well-nourished in no acute distress.  Appears ill  Resting comfortably at home.  Head is normocephalic, atraumatic.  No labored breathing.  Speech is clear and coherent with logical content.  Patient is alert and oriented at baseline.    Assessment and Plan: 1. Body aches  - COVID-19 At  Home Antigen Test (BINAXNOW COVID-19 AG HOME TEST) KIT; Use as directed.  Dispense: 1 kit; Refill: 1  Results are positive  2. COVID-19 Meds ordered this encounter  Medications   COVID-19 At Home Antigen Test [facility] COVID-19 AG HOME TEST) KIT    Sig: Use as directed.    Dispense:  1 kit    Refill:  1   molnupiravir EUA 200 mg CAPS    Sig: Take 4 capsules (800 mg total) by mouth 2 (two) times daily for 5 days.    Dispense:  40 capsule    Refill:  0     Advised patient to continue to treat symptoms with OTCs, may use mucinex for cough support, assure hydration and adequate caloric intake.  Rest Advil for pain and fever.       Follow Up Instructions: I discussed the assessment and treatment plan with the patient. The patient was provided an opportunity to ask questions and all were answered. The patient agreed with the plan and demonstrated an understanding of the instructions.  A copy of instructions were sent to the patient via MyChart.  The patient was advised to call back or seek an in-person evaluation if the symptoms worsen or if the condition fails to improve as anticipated.  Time:  I spent 25 minutes with the patient via telehealth technology discussing the above problems/concerns.    Apolonio Schneiders, FNP

## 2020-09-10 NOTE — Patient Instructions (Signed)
You are being prescribed MOLNUPIRAVIR for COVID-19 infection.    Please pick up your [facility] at: [facility] on [address] in [place]   Please call the pharmacy or go through the drive through vs going inside if you are picking up the mediation yourself to prevent further spread. If prescribed to a [facility] affiliated pharmacy, a pharmacist will bring the medication out to your car.   ADMINISTRATION INSTRUCTIONS: Take with or without food. Swallow the tablets whole. Don't chew, crush, or break the medications because it might not work as well  For each dose of the medication, you should be taking FOUR tablets at one time, TWICE a day   Finish your full five-day course of Molnupiravir even if you feel better before you're done. Stopping this medication too early can make it less effective to prevent severe illness related to COVID19.    Molnupiravir is prescribed for YOU ONLY. Don't share it with others, even if they have similar symptoms as you. This medication might not be right for everyone.   Make sure to take steps to protect yourself and others while you're taking this medication in order to get well soon and to prevent others from getting sick with COVID-19.   **If you are of childbearing potential (any gender) - it is advised to not get pregnant while taking this medication and recommended that condoms are used for female partners the next 3 months after taking the medication out of extreme caution    COMMON SIDE EFFECTS: Diarrhea Nausea  Dizziness    If your COVID-19 symptoms get worse, get medical help right away. Call 911 if you experience symptoms such as worsening cough, trouble breathing, chest pain that doesn't go away, confusion, a hard time staying awake, and pale or blue-colored skin. This medication won't prevent all COVID-19 cases from getting worse.

## 2020-09-16 ENCOUNTER — Encounter: Payer: Self-pay | Admitting: Nurse Practitioner

## 2020-10-21 ENCOUNTER — Ambulatory Visit: Payer: No Typology Code available for payment source | Admitting: Dietician

## 2020-10-25 ENCOUNTER — Ambulatory Visit: Payer: No Typology Code available for payment source | Admitting: Neurology

## 2021-01-03 ENCOUNTER — Telehealth: Payer: Self-pay

## 2021-01-03 DIAGNOSIS — E038 Other specified hypothyroidism: Secondary | ICD-10-CM

## 2021-01-03 MED ORDER — LEVOTHYROXINE SODIUM 137 MCG PO TABS: 137.0000 ug | ORAL_TABLET | Freq: Every day | ORAL | 1 refills | Status: AC

## 2021-01-03 NOTE — Telephone Encounter (Signed)
Patient calls nurse line requesting a change in pharmacy. [facility], however her insurance has changed and now uses [facility] on [address].   I have called and [facility].   [phone] on [address].

## 2021-05-09 ENCOUNTER — Other Ambulatory Visit: Payer: Self-pay | Admitting: Registered Nurse

## 2021-05-09 ENCOUNTER — Ambulatory Visit (HOSPITAL_COMMUNITY)
Admission: EM | Admit: 2021-05-09 | Discharge: 2021-05-09 | Disposition: A | Discharge status: Home / Self Care | Payer: 59 | Attending: Registered Nurse | Admitting: Registered Nurse

## 2021-05-09 ENCOUNTER — Encounter (HOSPITAL_COMMUNITY): Payer: Self-pay | Admitting: Registered Nurse

## 2021-05-09 DIAGNOSIS — R454 Irritability and anger: Secondary | ICD-10-CM | POA: Diagnosis not present

## 2021-05-09 DIAGNOSIS — F4310 Post-traumatic stress disorder, unspecified: Secondary | ICD-10-CM | POA: Diagnosis not present

## 2021-05-09 DIAGNOSIS — Z638 Other specified problems related to primary support group: Secondary | ICD-10-CM
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
## 2021-05-09 NOTE — Discharge Instructions (Addendum)
[name] ?[address] [place], [place] [address] ? ~[address] ?Phone: [phone] ?Fax: [phone] ? ? ?[facility]  ?[address] ?[place], [place] [address] ?P: [phone] ? ?OPENING SUMMER OF 2022! ?[facility]   ?[address] ?[place], [place] [address] ?P: [phone] ?

## 2021-05-10 ENCOUNTER — Telehealth (HOSPITAL_COMMUNITY): Payer: Self-pay | Admitting: Psychiatry

## 2021-05-29 ENCOUNTER — Other Ambulatory Visit: Payer: Self-pay | Admitting: Family Medicine

## 2021-05-29 DIAGNOSIS — E038 Other specified hypothyroidism: Secondary | ICD-10-CM

## 2021-05-30 ENCOUNTER — Encounter: Payer: Self-pay | Admitting: Family Medicine

## 2021-06-12 ENCOUNTER — Other Ambulatory Visit: Payer: Self-pay | Admitting: Family Medicine

## 2021-06-12 ENCOUNTER — Ambulatory Visit
Admission: RE | Admit: 2021-06-12 | Discharge: 2021-06-12 | Disposition: A | Discharge status: Home / Self Care | Payer: 59 | Source: Ambulatory Visit | Attending: Family Medicine | Admitting: Family Medicine

## 2021-06-12 DIAGNOSIS — M79671 Pain in right foot: Secondary | ICD-10-CM

## 2021-09-20 ENCOUNTER — Encounter (INDEPENDENT_AMBULATORY_CARE_PROVIDER_SITE_OTHER): Payer: Self-pay

## 2021-10-31 ENCOUNTER — Other Ambulatory Visit: Payer: Self-pay

## 2021-10-31 ENCOUNTER — Encounter (HOSPITAL_COMMUNITY): Payer: Self-pay | Admitting: Emergency Medicine

## 2021-10-31 ENCOUNTER — Ambulatory Visit (HOSPITAL_COMMUNITY)
Admission: EM | Admit: 2021-10-31 | Discharge: 2021-10-31 | Disposition: A | Discharge status: Home / Self Care | Payer: 59 | Attending: Emergency Medicine | Admitting: Emergency Medicine

## 2021-10-31 DIAGNOSIS — N390 Urinary tract infection, site not specified: Secondary | ICD-10-CM

## 2021-10-31 DIAGNOSIS — N951 Menopausal and female climacteric states: Secondary | ICD-10-CM

## 2021-10-31 LAB — POCT URINALYSIS DIPSTICK, ED / UC
Bilirubin Urine: NEGATIVE
Glucose, UA: >=1000 mg/dL — AB
Ketones, ur: 15 mg/dL — AB
Nitrite: POSITIVE — AB
Protein, ur: 100 mg/dL — AB
Specific Gravity, Urine: 1.01 (ref 1.005–1.030)
Urobilinogen, UA: 2 mg/dL — ABNORMAL HIGH (ref 0.0–1.0)
pH: 5 (ref 5.0–8.0)

## 2021-10-31 MED ORDER — SULFAMETHOXAZOLE-TRIMETHOPRIM 800-160 MG PO TABS: 1.0000 | ORAL_TABLET | Freq: Two times a day (BID) | ORAL | 0 refills | Status: AC

## 2021-10-31 NOTE — ED Provider Notes (Signed)
[place]    CSN: [id number] Arrival date & time: 10/31/21  1455      History   Chief Complaint Chief Complaint  Patient presents with   Dysuria    HPI Donna Cowan is a 56 y.o. female.  Presents with 3-day history of urinary frequency.  Reports she noticed blood in her urine twice today.  Some discomfort with urination but denies pain.  Recent intercourse, reports vaginal dryness and pain when trying to have intercourse. LMP many years ago. Vaginal dryness for a while  Denies fever, flank pain, abdominal pain, n/v  Took one AZO pill today  Past Medical History:  Diagnosis Date   Back pain    Chronic back pain    Chronic neck pain    Diabetes (HCC)    Edema of both lower extremities    Hypothyroidism    Joint pain    Lactose intolerance    Sleep apnea    Thyroid disease     Patient Active Problem List   Diagnosis Date Noted   Anger reaction 05/09/2021   Family discord 05/09/2021   PTSD (post-traumatic stress disorder) 05/09/2021   Chronic right-sided headache 07/19/2020   Right sided temporal headache 07/14/2020   Carpal tunnel syndrome 06/20/2020   Leg swelling 06/20/2020   Ankle strain 06/20/2020   Tinnitus of right ear 05/19/2020   Class 1 obesity due to excess calories with body mass index (BMI) of 31.0 to 31.9 in adult 08/12/2019   Bereavement reaction 03/29/2019   Type 2 diabetes mellitus without complications ([place]) 64/33/2951   Graves disease 03/29/2019   Healthcare maintenance 03/29/2019   Vitamin D deficiency 03/29/2019   Back injury, sequela 03/29/2019    Past Surgical History:  Procedure Laterality Date   THYROID SURGERY     TUBAL LIGATION      OB History     Gravida  2   Para  2   Term      Preterm      AB      Living         SAB      IAB      Ectopic      Multiple      Live Births               Home Medications    Prior to Admission medications   Medication Sig Start Date End Date  Taking? Authorizing Provider  sulfamethoxazole-trimethoprim (BACTRIM DS) 800-160 MG tablet Take 1 tablet by mouth 2 (two) times daily for 5 days. 10/31/21 11/05/21 Yes Dajana Gehrig, Wells Guiles, PA-C  cholecalciferol (VITAMIN D3) 25 MCG (1000 UNIT) tablet Take 1,000 Units by mouth daily.    [provider]  COVID-19 At Home Antigen Test [facility] COVID-19 AG HOME TEST) KIT Use as directed. 09/10/20   Apolonio Schneiders, FNP  empagliflozin (JARDIANCE) 10 MG TABS tablet Take 1 tablet (10 mg total) by mouth daily before breakfast. Patient not taking: Reported on 10/31/2021 02/09/20   Gladys Damme, MD  levothyroxine (SYNTHROID) 137 MCG tablet Take 1 tablet (137 mcg total) by mouth daily before breakfast. 01/03/21   Gladys Damme, MD  meloxicam (MOBIC) 7.5 MG tablet Take 7.5 mg by mouth 2 (two) times daily. 08/29/21   [provider]    Family History Family History  Problem Relation Age of Onset   Diabetes Mother    High blood pressure Mother    Stroke Mother    Kidney disease Mother    Sleep  apnea Mother    Obesity Mother    Stroke Father     Social History Social History   Tobacco Use   Smoking status: Former   Smokeless tobacco: Never  Scientific laboratory technician Use: Never used  Substance Use Topics   Alcohol use: Never   Drug use: Never     Allergies   Codeine   Review of Systems Review of Systems Per HPI  Physical Exam Triage Vital Signs ED Triage Vitals  Enc Vitals Group     BP 10/31/21 1515 (!) 142/94     Pulse Rate 10/31/21 1515 72     Resp 10/31/21 1515 20     Temp 10/31/21 1515 98.5 F (36.9 C)     Temp Source 10/31/21 1515 Oral     SpO2 10/31/21 1515 94 %     Weight --      Height --      Head Circumference --      Peak Flow --      Pain Score 10/31/21 1512 0     Pain Loc --      Pain Edu? --      Excl. in [place]? --    No data found.  Updated Vital Signs BP (!) 142/94 (BP Location: Right Arm) Comment (BP Location): large cuff  Pulse 72   Temp  98.5 F (36.9 C) (Oral)   Resp 20   SpO2 94%    Physical Exam Vitals and nursing note reviewed.  Constitutional:      General: She is not in acute distress. HENT:     Mouth/Throat:     Pharynx: Oropharynx is clear.  Eyes:     Conjunctiva/sclera: Conjunctivae normal.  Cardiovascular:     Rate and Rhythm: Normal rate and regular rhythm.     Pulses: Normal pulses.     Heart sounds: Normal heart sounds.  Pulmonary:     Effort: Pulmonary effort is normal.     Breath sounds: Normal breath sounds.  Abdominal:     Palpations: Abdomen is soft.     Tenderness: There is no abdominal tenderness. There is no right CVA tenderness or left CVA tenderness.  Neurological:     Mental Status: She is alert and oriented to person, place, and time.      UC Treatments / Results  Labs (all labs ordered are listed, but only abnormal results are displayed) Labs Reviewed  POCT URINALYSIS DIPSTICK, ED / UC - Abnormal; Notable for the following components:      Result Value   Glucose, UA >=1000 (*)    Ketones, ur 15 (*)    Hgb urine dipstick TRACE (*)    Protein, ur 100 (*)    Urobilinogen, UA 2.0 (*)    Nitrite POSITIVE (*)    Leukocytes,Ua LARGE (*)    All other components within normal limits  URINE CULTURE    EKG   Radiology No results found.  Procedures Procedures (including critical care time)  Medications Ordered in UC Medications - No data to display  Initial Impression / Assessment and Plan / UC Course  I have reviewed the triage vital signs and the nursing notes.  Pertinent labs & imaging results that were available during my care of the patient were reviewed by me and considered in my medical decision making (see chart for details).  UA with positive nitrates, large leuks. Culture pending. Treat with bactrim twice daily for 5 days No CVA tenderness or systemic symptoms  Discussed following up with ob/gyn regarding vaginal dryness, menopause symptoms. Recommend trying  lubricant for intercourse. Return precautions discussed. Patient agrees to plan  Final Clinical Impressions(s) / UC Diagnoses   Final diagnoses:  Lower urinary tract infectious disease  Vaginal dryness, menopausal     Discharge Instructions      Please take medication as prescribed.. Take with food to avoid upset stomach.  Increase your water intake.  Return to urgent care if symptoms don't improve after 3 days.  I recommend following up with your ob/gyn as needed.    ED Prescriptions     Medication Sig Dispense Auth. Provider   sulfamethoxazole-trimethoprim (BACTRIM DS) 800-160 MG tablet Take 1 tablet by mouth 2 (two) times daily for 5 days. 10 tablet Sherman Lipuma, Wells Guiles, PA-C      PDMP not reviewed this encounter.   [place], [place] 10/31/21 1616

## 2021-10-31 NOTE — Discharge Instructions (Addendum)
Please take medication as prescribed.. Take with food to avoid upset stomach.  Increase your water intake.  Return to urgent care if symptoms don't improve after 3 days.  I recommend following up with your ob/gyn as needed.

## 2021-10-31 NOTE — ED Triage Notes (Signed)
Patient reports seeing blood in urine today, not feeling "normal" for 2 days.  Denies pain.

## 2021-11-01 LAB — URINE CULTURE

## 2022-02-27 ENCOUNTER — Other Ambulatory Visit: Payer: Self-pay | Admitting: Nephrology

## 2022-02-27 DIAGNOSIS — E1129 Type 2 diabetes mellitus with other diabetic kidney complication: Secondary | ICD-10-CM

## 2022-02-27 DIAGNOSIS — I129 Hypertensive chronic kidney disease with stage 1 through stage 4 chronic kidney disease, or unspecified chronic kidney disease: Secondary | ICD-10-CM

## 2022-02-27 DIAGNOSIS — N1831 Chronic kidney disease, stage 3a: Secondary | ICD-10-CM

## 2022-02-27 DIAGNOSIS — E1122 Type 2 diabetes mellitus with diabetic chronic kidney disease: Secondary | ICD-10-CM

## 2022-02-27 DIAGNOSIS — E039 Hypothyroidism, unspecified: Secondary | ICD-10-CM

## 2022-03-01 ENCOUNTER — Ambulatory Visit
Admission: RE | Admit: 2022-03-01 | Discharge: 2022-03-01 | Disposition: A | Discharge status: Home / Self Care | Payer: 59 | Source: Ambulatory Visit | Attending: Nephrology | Admitting: Nephrology

## 2022-03-01 DIAGNOSIS — E039 Hypothyroidism, unspecified: Secondary | ICD-10-CM

## 2022-03-01 DIAGNOSIS — E1122 Type 2 diabetes mellitus with diabetic chronic kidney disease: Secondary | ICD-10-CM

## 2022-03-01 DIAGNOSIS — E1129 Type 2 diabetes mellitus with other diabetic kidney complication: Secondary | ICD-10-CM

## 2022-03-01 DIAGNOSIS — I129 Hypertensive chronic kidney disease with stage 1 through stage 4 chronic kidney disease, or unspecified chronic kidney disease: Secondary | ICD-10-CM

## 2022-03-01 DIAGNOSIS — N1831 Chronic kidney disease, stage 3a: Secondary | ICD-10-CM

## 2022-06-26 IMAGING — DX DG FOOT COMPLETE 3+V*R*
3 series · 3 of 3 positions shown · non-contrast
Comparison: None.

CLINICAL DATA: Right lateral foot/heel pain.

EXAM:
RIGHT FOOT COMPLETE - 3+ VIEW

[dg foot complete right (1 of 3)]
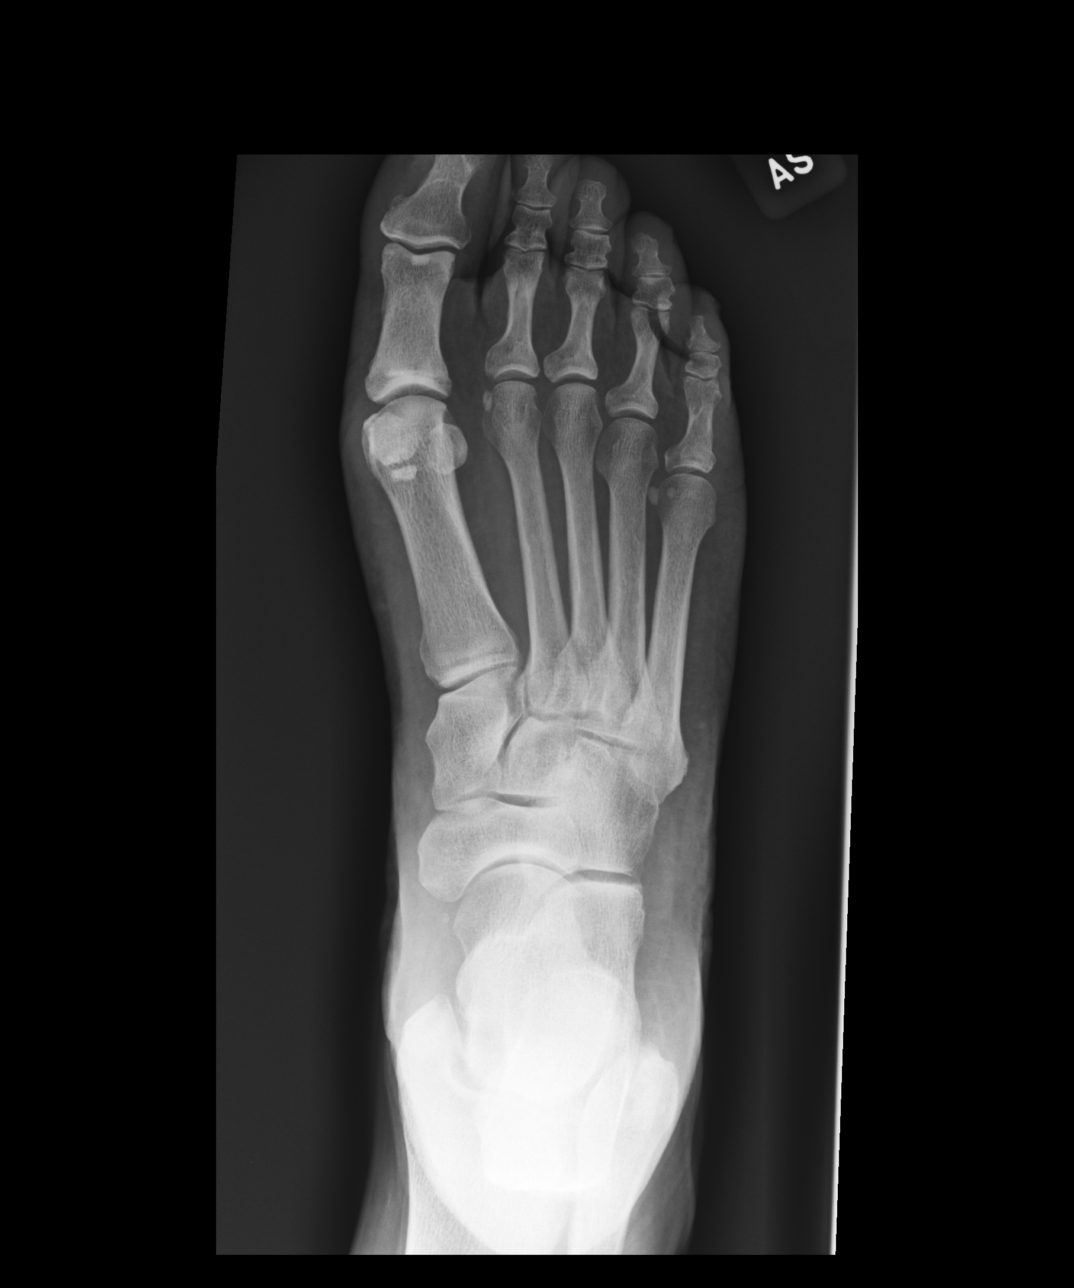

[dg foot complete right (2 of 3)]
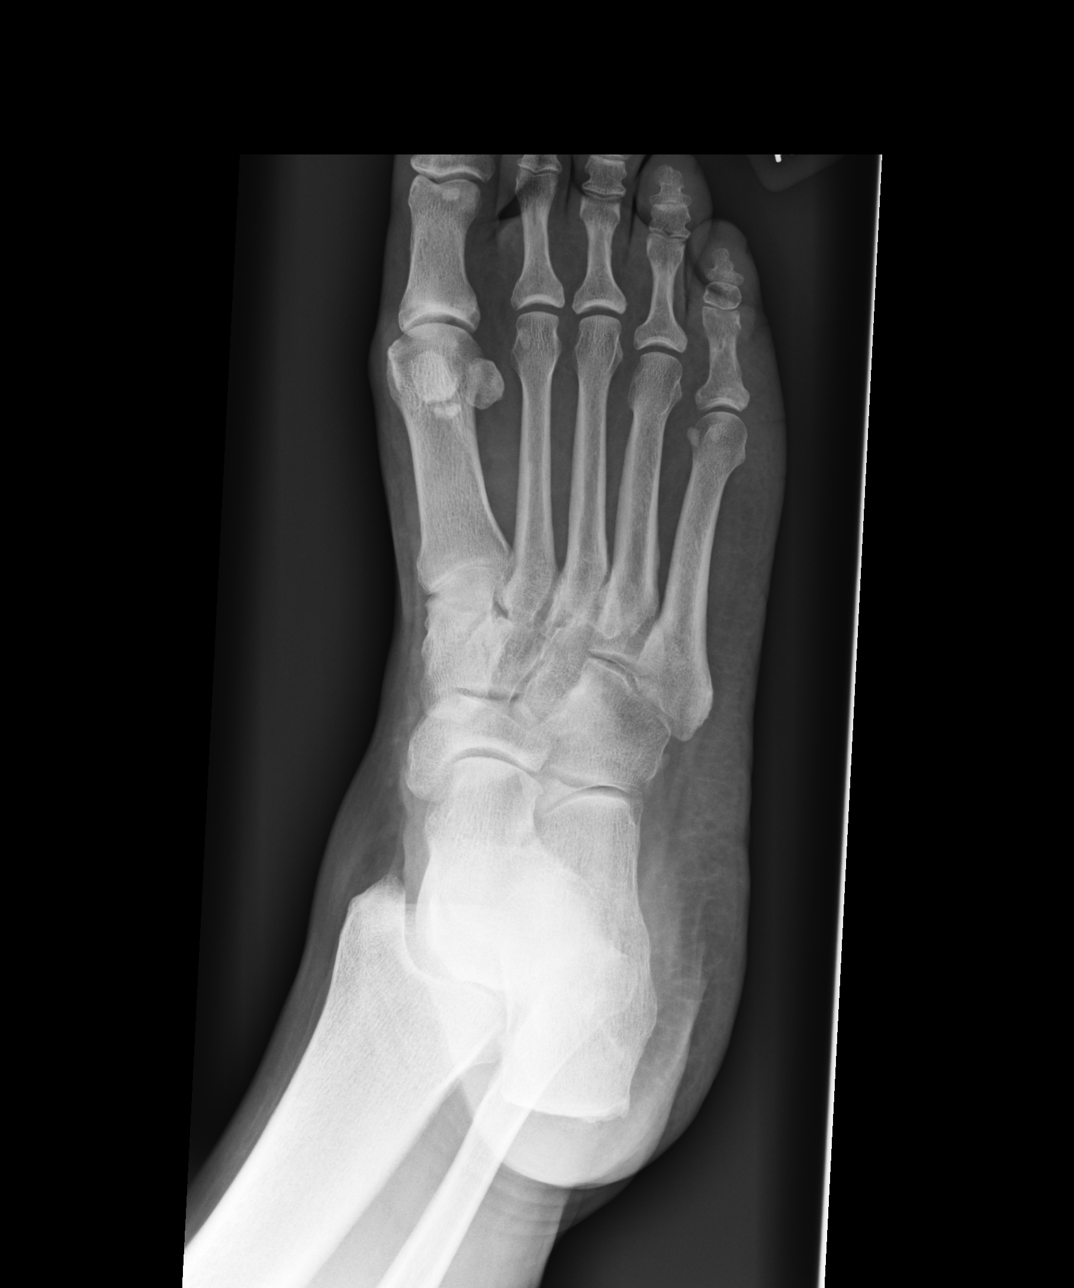

[dg foot complete right (3 of 3)]
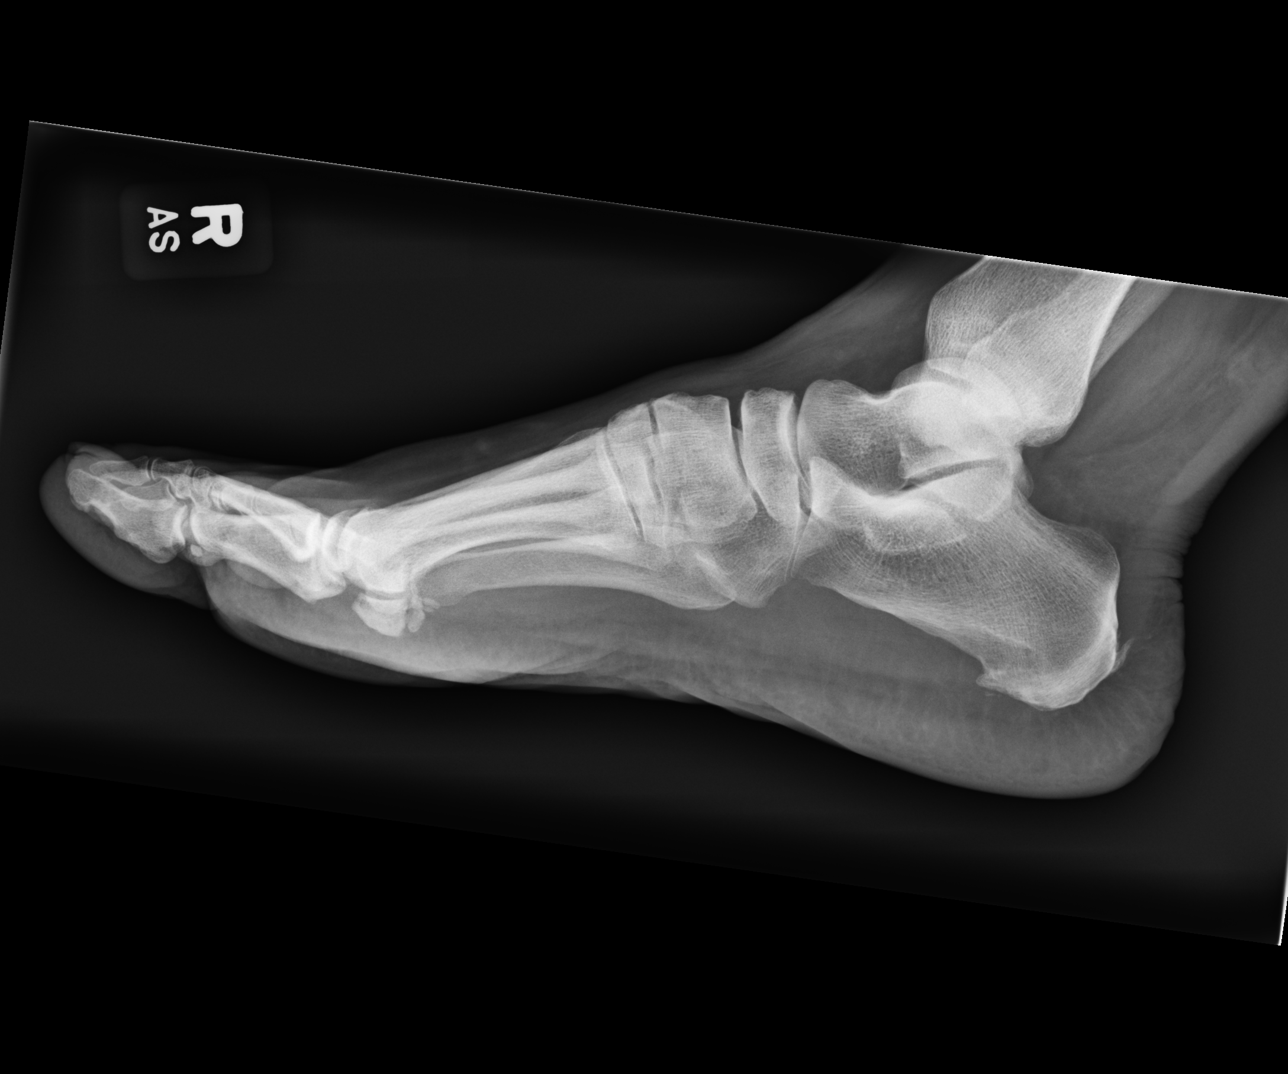

[3 of 3 positions shown; findings below may reference images not displayed]

FINDINGS: Mild hallux valgus. Minimal great toe metatarsophalangeal joint
space narrowing and peripheral degenerative spurring. Mild chronic
enthesopathic change at the Achilles insertion on the calcaneus.
Small plantar calcaneal heel spur. No acute fracture or dislocation.
IMPRESSION: :
IMPRESSION: 1. Mild hallux valgus. Minimal great toe metatarsophalangeal
osteoarthritis.
2. Small plantar calcaneal heel spur.

## 2022-07-14 ENCOUNTER — Emergency Department (HOSPITAL_COMMUNITY): Payer: 59

## 2022-07-14 ENCOUNTER — Other Ambulatory Visit: Payer: Self-pay

## 2022-07-14 ENCOUNTER — Ambulatory Visit
Admission: EM | Admit: 2022-07-14 | Discharge: 2022-07-14 | Disposition: A | Discharge status: Home / Self Care | Payer: 59

## 2022-07-14 ENCOUNTER — Emergency Department (HOSPITAL_COMMUNITY)
Admission: EM | Admit: 2022-07-14 | Discharge: 2022-07-14 | Disposition: A | Discharge status: Home / Self Care | Payer: 59 | Attending: Emergency Medicine | Admitting: Emergency Medicine

## 2022-07-14 ENCOUNTER — Encounter (HOSPITAL_COMMUNITY): Payer: Self-pay

## 2022-07-14 DIAGNOSIS — Z79899 Other long term (current) drug therapy: Secondary | ICD-10-CM | POA: Insufficient documentation

## 2022-07-14 DIAGNOSIS — Z87891 Personal history of nicotine dependence: Secondary | ICD-10-CM | POA: Insufficient documentation

## 2022-07-14 DIAGNOSIS — R296 Repeated falls: Secondary | ICD-10-CM | POA: Insufficient documentation

## 2022-07-14 DIAGNOSIS — M79602 Pain in left arm: Secondary | ICD-10-CM | POA: Diagnosis not present

## 2022-07-14 DIAGNOSIS — W19XXXA Unspecified fall, initial encounter: Secondary | ICD-10-CM | POA: Diagnosis not present

## 2022-07-14 DIAGNOSIS — M79601 Pain in right arm: Secondary | ICD-10-CM

## 2022-07-14 DIAGNOSIS — M546 Pain in thoracic spine: Secondary | ICD-10-CM | POA: Diagnosis not present

## 2022-07-14 DIAGNOSIS — M549 Dorsalgia, unspecified: Secondary | ICD-10-CM | POA: Diagnosis present

## 2022-07-14 DIAGNOSIS — R791 Abnormal coagulation profile: Secondary | ICD-10-CM | POA: Diagnosis not present

## 2022-07-14 DIAGNOSIS — M542 Cervicalgia: Secondary | ICD-10-CM | POA: Diagnosis not present

## 2022-07-14 DIAGNOSIS — E039 Hypothyroidism, unspecified: Secondary | ICD-10-CM | POA: Insufficient documentation

## 2022-07-14 DIAGNOSIS — Z7984 Long term (current) use of oral hypoglycemic drugs: Secondary | ICD-10-CM | POA: Diagnosis not present

## 2022-07-14 DIAGNOSIS — E119 Type 2 diabetes mellitus without complications: Secondary | ICD-10-CM | POA: Diagnosis not present

## 2022-07-14 LAB — APTT: aPTT: 29 s (ref 24–36)

## 2022-07-14 LAB — CBC
HCT: 44.6 % (ref 36.0–46.0)
Hemoglobin: 15 g/dL (ref 12.0–15.0)
MCH: 30.4 pg (ref 26.0–34.0)
MCHC: 33.6 g/dL (ref 30.0–36.0)
MCV: 90.5 fL (ref 80.0–100.0)
Platelets: 190 10*3/uL (ref 150–400)
RBC: 4.93 MIL/uL (ref 3.87–5.11)
RDW: 13.2 % (ref 11.5–15.5)
WBC: 6.2 10*3/uL (ref 4.0–10.5)
nRBC: 0 % (ref 0.0–0.2)

## 2022-07-14 LAB — DIFFERENTIAL
Abs Immature Granulocytes: 0.01 K/uL (ref 0.00–0.07)
Basophils Absolute: 0 K/uL (ref 0.0–0.1)
Basophils Relative: 1 %
Eosinophils Absolute: 0.1 K/uL (ref 0.0–0.5)
Eosinophils Relative: 1 %
Immature Granulocytes: 0 %
Lymphocytes Relative: 38 %
Lymphs Abs: 2.4 K/uL (ref 0.7–4.0)
Monocytes Absolute: 0.4 K/uL (ref 0.1–1.0)
Monocytes Relative: 7 %
Neutro Abs: 3.3 K/uL (ref 1.7–7.7)
Neutrophils Relative %: 53 %

## 2022-07-14 LAB — I-STAT CHEM 8, ED
BUN: 12 mg/dL (ref 6–20)
Calcium, Ion: 1.1 mmol/L — ABNORMAL LOW (ref 1.15–1.40)
Chloride: 102 mmol/L (ref 98–111)
Creatinine, Ser: 0.9 mg/dL (ref 0.44–1.00)
Glucose, Bld: 284 mg/dL — ABNORMAL HIGH (ref 70–99)
HCT: 47 % — ABNORMAL HIGH (ref 36.0–46.0)
Hemoglobin: 16 g/dL — ABNORMAL HIGH (ref 12.0–15.0)
Potassium: 4.3 mmol/L (ref 3.5–5.1)
Sodium: 137 mmol/L (ref 135–145)
TCO2: 26 mmol/L (ref 22–32)

## 2022-07-14 LAB — COMPREHENSIVE METABOLIC PANEL WITH GFR
ALT: 31 U/L (ref 0–44)
AST: 25 U/L (ref 15–41)
Albumin: 4.1 g/dL (ref 3.5–5.0)
Alkaline Phosphatase: 115 U/L (ref 38–126)
Anion gap: 8 (ref 5–15)
BUN: 11 mg/dL (ref 6–20)
CO2: 25 mmol/L (ref 22–32)
Calcium: 9.3 mg/dL (ref 8.9–10.3)
Chloride: 103 mmol/L (ref 98–111)
Creatinine, Ser: 1.03 mg/dL — ABNORMAL HIGH (ref 0.44–1.00)
GFR, Estimated: >60 mL/min
Glucose, Bld: 286 mg/dL — ABNORMAL HIGH (ref 70–99)
Potassium: 4.2 mmol/L (ref 3.5–5.1)
Sodium: 136 mmol/L (ref 135–145)
Total Bilirubin: 0.7 mg/dL (ref 0.3–1.2)
Total Protein: 7.2 g/dL (ref 6.5–8.1)

## 2022-07-14 LAB — PROTIME-INR
INR: 1.1 (ref 0.8–1.2)
Prothrombin Time: 13.9 seconds (ref 11.4–15.2)

## 2022-07-14 LAB — ETHANOL: Alcohol, Ethyl (B): <10 mg/dL (ref <10)

## 2022-07-14 MED ORDER — HYDROMORPHONE HCL 1 MG/ML IJ SOLN
1.0000 mg | Freq: Once | INTRAMUSCULAR | Status: AC
  Administered 2022-07-14: 1 mg via INTRAVENOUS
  Filled 2022-07-14: qty 1

## 2022-07-14 MED ORDER — LIDOCAINE 5 % EX PTCH
1.0000 | MEDICATED_PATCH | Freq: Once | CUTANEOUS | Status: DC
  Administered 2022-07-14: 1 via TRANSDERMAL
  Filled 2022-07-14: qty 1

## 2022-07-14 MED ORDER — SODIUM CHLORIDE 0.9 % IV BOLUS
1000.0000 mL | Freq: Once | INTRAVENOUS | Status: AC
  Administered 2022-07-14: 1000 mL via INTRAVENOUS

## 2022-07-14 MED ORDER — MORPHINE SULFATE (PF) 4 MG/ML IV SOLN
4.0000 mg | Freq: Once | INTRAVENOUS | Status: AC
  Administered 2022-07-14: 4 mg via INTRAVENOUS
  Filled 2022-07-14: qty 1

## 2022-07-14 MED ORDER — ONDANSETRON HCL 4 MG/2ML IJ SOLN
4.0000 mg | Freq: Once | INTRAMUSCULAR | Status: AC
  Administered 2022-07-14: 4 mg via INTRAVENOUS
  Filled 2022-07-14: qty 2

## 2022-07-14 MED ORDER — OXYCODONE HCL 5 MG PO TABS: 5.0000 mg | ORAL_TABLET | ORAL | 0 refills | Status: DC | PRN

## 2022-07-14 MED ORDER — ACETAMINOPHEN 325 MG PO TABS: 650.0000 mg | ORAL_TABLET | Freq: Four times a day (QID) | ORAL | 0 refills | Status: AC | PRN

## 2022-07-14 MED ORDER — LIDOCAINE 5 % EX PTCH: 1.0000 | MEDICATED_PATCH | Freq: Every day | CUTANEOUS | 0 refills | Status: AC | PRN

## 2022-07-14 MED ORDER — IBUPROFEN 600 MG PO TABS: 600.0000 mg | ORAL_TABLET | Freq: Four times a day (QID) | ORAL | 0 refills | Status: AC | PRN

## 2022-07-14 NOTE — ED Provider Notes (Signed)
[place] EMERGENCY DEPARTMENT AT [facility] Provider Note  CSN: [id number] Arrival date & time: 07/14/22 1056  Chief Complaint(s) Fall  HPI Donna Cowan is a 57 y.o. female with past medical history as below, significant for chronic back pain, chronic neck pain, peroneal tendinitis of right lower extremity, right foot neuritis, chronic lower extremity pain, plantar fasciitis, PTSD who presents to the ED with complaint of recurrent falls, extremity pain.  Past Medical History Past Medical History:  Diagnosis Date   Back pain    Chronic back pain    Chronic neck pain    Diabetes (HCC)    Edema of both lower extremities    Hypothyroidism    Joint pain    Lactose intolerance    Sleep apnea    Thyroid disease    Patient Active Problem List   Diagnosis Date Noted   Anger reaction 05/09/2021   Family discord 05/09/2021   PTSD (post-traumatic stress disorder) 05/09/2021   Chronic right-sided headache 07/19/2020   Right sided temporal headache 07/14/2020   Carpal tunnel syndrome 06/20/2020   Leg swelling 06/20/2020   Ankle strain 06/20/2020   Tinnitus of right ear 05/19/2020   Class 1 obesity due to excess calories with body mass index (BMI) of 31.0 to 31.9 in adult 08/12/2019   Bereavement reaction 03/29/2019   Type 2 diabetes mellitus without complications (HCC) 03/29/2019   Graves disease 03/29/2019   Healthcare maintenance 03/29/2019   Vitamin D deficiency 03/29/2019   Back injury, sequela 03/29/2019   Home Medication(s) Prior to Admission medications   Medication Sig Start Date End Date Taking? Authorizing Provider  cholecalciferol (VITAMIN D3) 25 MCG (1000 UNIT) tablet Take 1,000 Units by mouth daily.    [provider]  COVID-19 At Home Antigen Test [facility] COVID-19 AG HOME TEST) KIT Use as directed. 09/10/20   Viviano Simas, FNP  Elderberry 500 MG CAPS Take by mouth.    [provider]  empagliflozin (JARDIANCE) 10 MG TABS tablet Take  1 tablet (10 mg total) by mouth daily before breakfast. Patient not taking: Reported on 10/31/2021 02/09/20   Shirlean Mylar, MD  levothyroxine (SYNTHROID) 137 MCG tablet Take 1 tablet (137 mcg total) by mouth daily before breakfast. 01/03/21   Shirlean Mylar, MD  meloxicam (MOBIC) 7.5 MG tablet Take 7.5 mg by mouth 2 (two) times daily. 08/29/21   [provider]                                                                                                                                    Past Surgical History Past Surgical History:  Procedure Laterality Date   THYROID SURGERY     TUBAL LIGATION     Family History Family History  Problem Relation Age of Onset   Diabetes Mother    High blood pressure Mother    Stroke Mother    Kidney disease Mother  Sleep apnea Mother    Obesity Mother    Stroke Father     Social History Social History   Tobacco Use   Smoking status: Former   Smokeless tobacco: Never  Building services engineer Use: Never used  Substance Use Topics   Alcohol use: Never   Drug use: Never   Allergies Codeine  Review of Systems Review of Systems  Physical Exam Vital Signs  I have reviewed the triage vital signs BP (!) 164/101 (BP Location: Left Leg)   Pulse 63   Temp 98.1 F (36.7 C) (Oral)   Resp 16   Ht 5\' 6"  (1.676 m)   Wt 99.8 kg   SpO2 96%   BMI 35.51 kg/m  Physical Exam  ED Results and Treatments Labs (all labs ordered are listed, but only abnormal results are displayed) Labs Reviewed  COMPREHENSIVE METABOLIC PANEL - Abnormal; Notable for the following components:      Result Value   Glucose, Bld 286 (*)    Creatinine, Ser 1.03 (*)    All other components within normal limits  I-STAT CHEM 8, ED - Abnormal; Notable for the following components:   Glucose, Bld 284 (*)    Calcium, Ion 1.10 (*)    Hemoglobin 16.0 (*)    HCT 47.0 (*)    All other components within normal limits  PROTIME-INR  APTT  CBC  DIFFERENTIAL   ETHANOL  CBG MONITORING, ED                                                                                                                          Radiology CT HEAD WO CONTRAST  Result Date: 07/14/2022 CLINICAL DATA:  56 year old female status post fall down stairs. Recurrent falls, loss of balance. EXAM: CT HEAD WITHOUT CONTRAST TECHNIQUE: Contiguous axial images were obtained from the base of the skull through the vertex without intravenous contrast. RADIATION DOSE REDUCTION: This exam was performed according to the departmental dose-optimization program which includes automated exposure control, adjustment of the mA and/or kV according to patient size and/or use of iterative reconstruction technique. COMPARISON:  Brain MRI 05/20/2020. FINDINGS: Brain: Cavum septum pellucidum, normal variant. No midline shift, ventriculomegaly, mass effect, evidence of mass lesion, intracranial hemorrhage or evidence of cortically based acute infarction. Emmali Karow-white matter differentiation is within normal limits throughout the brain. Faint left basal ganglia vascular calcification incidentally noted. Vascular: Mild Calcified atherosclerosis at the skull base. No suspicious intracranial vascular hyperdensity. Skull: Scaphocephaly, normal variant. No acute osseous abnormality identified. Sinuses/Orbits: Visualized paranasal sinuses and mastoids are clear. Other: Visualized orbits and scalp soft tissues are within normal limits. IMPRESSION: No acute traumatic injury identified. Normal for age noncontrast CT appearance of the brain. Electronically Signed   By: Odessa Fleming M.D.   On: 07/14/2022 11:46    Pertinent labs & imaging results that were available during my care of the patient were reviewed by me and considered in my medical decision making (see MDM  for details).  Medications Ordered in ED Medications - No data to display                                                                                                                                    Procedures Procedures  (including critical care time)  Medical Decision Making / ED Course    Medical Decision Making:    Donna Cowan is a 57 y.o. female with past medical history as below, significant for chronic back pain, chronic neck pain, peroneal tendinitis of right lower extremity, right foot neuritis, chronic lower extremity pain, plantar fasciitis, PTSD who presents to the ED with complaint of recurrent falls, extremity pain.. The complaint involves an extensive differential diagnosis and also carries with it a high risk of complications and morbidity.  Serious etiology was considered. Ddx includes but is not limited to: ***  Complete initial physical exam performed, notably the patient  was ***.    Reviewed and confirmed nursing documentation for past medical history, family history, social history.  Vital signs reviewed.        Additional history obtained: -Additional history obtained from {wsadditionalhistorian:28072} -External records from outside source obtained and reviewed including: Chart review including previous notes, labs, imaging, consultation notes including ***   Lab Tests: -I ordered, reviewed, and interpreted labs.   The pertinent results include:   Labs Reviewed  COMPREHENSIVE METABOLIC PANEL - Abnormal; Notable for the following components:      Result Value   Glucose, Bld 286 (*)    Creatinine, Ser 1.03 (*)    All other components within normal limits  I-STAT CHEM 8, ED - Abnormal; Notable for the following components:   Glucose, Bld 284 (*)    Calcium, Ion 1.10 (*)    Hemoglobin 16.0 (*)    HCT 47.0 (*)    All other components within normal limits  PROTIME-INR  APTT  CBC  DIFFERENTIAL  ETHANOL  CBG MONITORING, ED    Notable for ***  EKG   EKG Interpretation  Date/Time:    Ventricular Rate:    PR Interval:    QRS Duration:   QT Interval:    QTC Calculation:   R Axis:     Text Interpretation:            Imaging Studies ordered: I ordered imaging studies including *** I independently visualized the following imaging with scope of interpretation limited to determining acute life threatening conditions related to emergency care; findings noted above, significant for *** I independently visualized and interpreted imaging. I agree with the radiologist interpretation   Medicines ordered and prescription drug management: No orders of the defined types were placed in this encounter.   -I have reviewed the patients home medicines and have made adjustments as needed   Consultations Obtained: I requested consultation with the ***,  and discussed lab and imaging findings as well as pertinent plan - they  recommend: ***   Cardiac Monitoring: The patient was maintained on a cardiac monitor.  I personally viewed and interpreted the cardiac monitored which showed an underlying rhythm of: ***  Social Determinants of Health:  Diagnosis or treatment significantly limited by social determinants of health: {wssoc:28071}   Reevaluation: After the interventions noted above, I reevaluated the patient and found that they have {resolved/improved/worsened:23923::"improved"}  Co morbidities that complicate the patient evaluation  Past Medical History:  Diagnosis Date   Back pain    Chronic back pain    Chronic neck pain    Diabetes (HCC)    Edema of both lower extremities    Hypothyroidism    Joint pain    Lactose intolerance    Sleep apnea    Thyroid disease       Dispostion: Disposition decision including need for hospitalization was considered, and patient {wsdispo:28070::"discharged from emergency department."}    Final Clinical Impression(s) / ED Diagnoses Final diagnoses:  None     This chart was dictated using voice recognition software.  Despite best efforts to proofread,  errors can occur which can change the documentation meaning.

## 2022-07-14 NOTE — Discharge Instructions (Signed)
Please go straight to the emergency department as soon as you leave urgent care as I do think that you need imaging of her head.

## 2022-07-14 NOTE — ED Notes (Signed)
Patient is being discharged from the Urgent Care and sent to the Emergency Department via private vehicle . Per Donna Cowan patient is in need of higher level of care due to further evaluation. Patient is aware and verbalizes understanding of plan of care.  Vitals:   07/14/22 1004  BP: (!) 154/98  Pulse: 60  Resp: 18  Temp: 97.6 F (36.4 C)  SpO2: 95%

## 2022-07-14 NOTE — ED Provider Notes (Signed)
[name]    CSN: [id number] Arrival date & time: 07/14/22  0854      History   Chief Complaint Chief Complaint  Patient presents with   Arm Pain   Fall    HPI Donna Cowan is a 57 y.o. female.   Patient presents today after having multiple falls over the past few weeks.  Reports that she had a fall on 5/18 and 5/20.  Reports that she was on vacation when she fell down 5 steps after losing her balance.  Reports that she fell backwards onto her bottom but did not hit her head or lose consciousness.  States that she felt okay with no pain after this fall.  Then, she fell again on 5/20 down 4 steps going into the garage falling forward.  She states that she thought that she was going to hit her face on the truck so she used her arm to brace herself.  She denies hitting head or losing consciousness during this fall as well.  Denies taking blood thinning medications.  Reports that she started having some right forearm pain after fall that has been persistent.  Patient denies any history of musculoskeletal disorders or any recent dizziness, headache, blurred vision, nausea, vomiting.  She has been having left arm pain with decreased range of motion and neck and upper back pain.  Although, patient is concerned given that she has felt very unsteady and off balance at times over the past few weeks.  She states that she ran into a wall a few days ago.  States that she is concerned given that her grandmother was having similar symptoms and was diagnosed with "mini strokes".  She had been taking Tylenol for pain with minimal improvement.   Arm Pain  Fall    Past Medical History:  Diagnosis Date   Back pain    Chronic back pain    Chronic neck pain    Diabetes (HCC)    Edema of both lower extremities    Hypothyroidism    Joint pain    Lactose intolerance    Sleep apnea    Thyroid disease     Patient Active Problem List   Diagnosis Date Noted   Anger reaction  05/09/2021   Family discord 05/09/2021   PTSD (post-traumatic stress disorder) 05/09/2021   Chronic right-sided headache 07/19/2020   Right sided temporal headache 07/14/2020   Carpal tunnel syndrome 06/20/2020   Leg swelling 06/20/2020   Ankle strain 06/20/2020   Tinnitus of right ear 05/19/2020   Class 1 obesity due to excess calories with body mass index (BMI) of 31.0 to 31.9 in adult 08/12/2019   Bereavement reaction 03/29/2019   Type 2 diabetes mellitus without complications (HCC) 03/29/2019   Graves disease 03/29/2019   Healthcare maintenance 03/29/2019   Vitamin D deficiency 03/29/2019   Back injury, sequela 03/29/2019    Past Surgical History:  Procedure Laterality Date   THYROID SURGERY     TUBAL LIGATION      OB History     Gravida  2   Para  2   Term      Preterm      AB      Living         SAB      IAB      Ectopic      Multiple      Live Births  Home Medications    Prior to Admission medications   Medication Sig Start Date End Date Taking? Authorizing Provider  Elderberry 500 MG CAPS Take by mouth.   Yes [provider]  cholecalciferol (VITAMIN D3) 25 MCG (1000 UNIT) tablet Take 1,000 Units by mouth daily.    [provider]  COVID-19 At Home Antigen Test [facility] COVID-19 AG HOME TEST) KIT Use as directed. 09/10/20   Viviano Simas, FNP  empagliflozin (JARDIANCE) 10 MG TABS tablet Take 1 tablet (10 mg total) by mouth daily before breakfast. Patient not taking: Reported on 10/31/2021 02/09/20   Shirlean Mylar, MD  levothyroxine (SYNTHROID) 137 MCG tablet Take 1 tablet (137 mcg total) by mouth daily before breakfast. 01/03/21   Shirlean Mylar, MD  meloxicam (MOBIC) 7.5 MG tablet Take 7.5 mg by mouth 2 (two) times daily. 08/29/21   [provider]    Family History Family History  Problem Relation Age of Onset   Diabetes Mother    High blood pressure Mother    Stroke Mother    Kidney  disease Mother    Sleep apnea Mother    Obesity Mother    Stroke Father     Social History Social History   Tobacco Use   Smoking status: Former   Smokeless tobacco: Never  Building services engineer Use: Never used  Substance Use Topics   Alcohol use: Never   Drug use: Never     Allergies   Codeine   Review of Systems Review of Systems Per HPI  Physical Exam Triage Vital Signs ED Triage Vitals  Enc Vitals Group     BP 07/14/22 1004 (!) 154/98     Pulse Rate 07/14/22 1004 60     Resp 07/14/22 1004 18     Temp 07/14/22 1004 97.6 F (36.4 C)     Temp Source 07/14/22 1004 Oral     SpO2 07/14/22 1004 95 %     Weight --      Height --      Head Circumference --      Peak Flow --      Pain Score 07/14/22 1001 10     Pain Loc --      Pain Edu? --      Excl. in GC? --    No data found.  Updated Vital Signs BP (!) 154/98 (BP Location: Left Arm)   Pulse 60   Temp 97.6 F (36.4 C) (Oral)   Resp 18   SpO2 95%   Visual Acuity Right Eye Distance:   Left Eye Distance:   Bilateral Distance:    Right Eye Near:   Left Eye Near:    Bilateral Near:     Physical Exam Constitutional:      General: She is not in acute distress.    Appearance: Normal appearance. She is not toxic-appearing or diaphoretic.  HENT:     Head: Normocephalic and atraumatic.  Eyes:     Extraocular Movements: Extraocular movements intact.     Conjunctiva/sclera: Conjunctivae normal.     Pupils: Pupils are equal, round, and reactive to light.  Pulmonary:     Effort: Pulmonary effort is normal.  Musculoskeletal:     Comments: Patient has tenderness to palpation to right forearm throughout.  No obvious swelling, discoloration, lacerations, abrasions noted.  Grip strength is 5/5 and neurovascularly intact on the right upper extremity.  Patient has tenderness to palpation throughout upper back/lower neck and bilateral lateral neck muscles.  Limited range of motion of neck due to pain.  No direct  spinal tenderness, crepitus, step-off noted.  No tenderness to thoracic back or lumbar region.  She has tenderness throughout lateral portion of left shoulder and throughout left upper arm.  Grip strength is 5/5 but patient has pain with range of motion with abduction at 45 degrees.  Patient appears neurovascularly intact to the left upper extremity.  Neurological:     General: No focal deficit present.     Mental Status: She is alert and oriented to person, place, and time. Mental status is at baseline.     Cranial Nerves: Cranial nerves 2-12 are intact.     Sensory: Sensation is intact.     Gait: Gait is intact.     Comments: Patient has difficulty with lifting legs and appears weak in lower extremities.  Limited evaluation of upper extremities given patient has pain from falls.  Psychiatric:        Mood and Affect: Mood normal.        Behavior: Behavior normal.        Thought Content: Thought content normal.        Judgment: Judgment normal.      UC Treatments / Results  Labs (all labs ordered are listed, but only abnormal results are displayed) Labs Reviewed - No data to display  EKG   Radiology No results found.  Procedures Procedures (including critical care time)  Medications Ordered in UC Medications - No data to display  Initial Impression / Assessment and Plan / UC Course  I have reviewed the triage vital signs and the nursing notes.  Pertinent labs & imaging results that were available during my care of the patient were reviewed by me and considered in my medical decision making (see chart for details).     I am very concerned with patient's neurological status given that she has been having frequent falls, walking into walls, and her lower extremities appear very weak.  I do think the patient needs a more extensive evaluation with possible MRI of the head which cannot be provided here at urgent care.  Therefore, patient was advised to go to the ER for further  evaluation and management and was agreeable with plan.  She has her family member here to transport her so agree with patient self transport to the ER. Final Clinical Impressions(s) / UC Diagnoses   Final diagnoses:  Multiple falls  Bilateral arm pain  Neck pain     Discharge Instructions      Please go straight to the emergency department as soon as you leave urgent care as I do think that you need imaging of her head.    ED Prescriptions   None    PDMP not reviewed this encounter.   Gustavus Bryant, [place] 07/14/22 [phone]

## 2022-07-14 NOTE — ED Triage Notes (Signed)
Pt came to ED from  [facility] for multiple falls. Pt states she just loses balance, denies dizziness. Started 5/18. Denies blood thinners. Pt denies hitting head. [id number]

## 2022-07-14 NOTE — ED Notes (Signed)
Pt states she has hard time picking up simple things and opening water bottle.

## 2022-07-14 NOTE — ED Notes (Signed)
Patient returned from bathroom, IV fluids hooked back up, patient resting in bed at this time.

## 2022-07-14 NOTE — ED Triage Notes (Signed)
Pt states she fell on 5/18 (down 5 stairs) and 5/20. The patient states she braced herself with the right arm on the first fall and fell face forward on the second fall. The patient states she has been having pain to both arms, and has been having neck spasms. Pt reports she feels like she has not been as steady at times, gait is steady at this time.   Home interventions: Cold packs, tylenol arthritis

## 2022-07-14 NOTE — Discharge Instructions (Addendum)
It was a pleasure caring for you today in the emergency department.  Please return to the emergency department for any worsening or worrisome symptoms.  Please follow up with your pcp for further evaluation.

## 2022-08-02 ENCOUNTER — Encounter: Payer: Self-pay | Admitting: Neurology

## 2022-08-14 ENCOUNTER — Encounter: Payer: Self-pay | Admitting: Neurology

## 2022-08-14 ENCOUNTER — Ambulatory Visit (INDEPENDENT_AMBULATORY_CARE_PROVIDER_SITE_OTHER): Payer: 59 | Admitting: Neurology

## 2022-08-14 VITALS — BP 140/83 | HR 83 | Ht 66.0 in | Wt 207.0 lb

## 2022-08-14 DIAGNOSIS — M79601 Pain in right arm: Secondary | ICD-10-CM | POA: Diagnosis not present

## 2022-08-14 DIAGNOSIS — R202 Paresthesia of skin: Secondary | ICD-10-CM

## 2022-08-14 DIAGNOSIS — R296 Repeated falls: Secondary | ICD-10-CM

## 2022-08-14 DIAGNOSIS — R200 Anesthesia of skin: Secondary | ICD-10-CM | POA: Diagnosis not present

## 2022-08-14 NOTE — Progress Notes (Signed)
[name] Clinic Note - Initial Visit   Date: 08/14/2022   Donna Cowan MRN: [id number] DOB: 08-15-1965   Dear Dr. Pecola Leisure:  Thank you for your kind referral of Donna Cowan for consultation of falls. Although her history is well known to you, please allow [place] to reiterate it for the purpose of our medical record. The patient was accompanied to the clinic by self.    Donna Cowan is a 57 y.o. right-handed female with chronic back pain, plantar fascitis, and PTSD presenting for evaluation of falls.   IMPRESSION/PLAN: Falls, unclear etiology.  There is no evidence of myelopathy, neuropathy, or myopathy.  She retains consciousness making seizures unlikely. Her exam shows mild right arm and leg weakness with normal reflexes and sensation.  To further evaluation, I will order MRI brain wo contrast.  Going forward, consider MRI lumbar spine.   2.  Right arm pain ?cervical radiculopathy vs musculoskeletal pain.  Pain seems to be throbbing which suggests the latter.   I will refer her for neck PT to treat if there is any cervical radicular component to her pain.    3.  She does report hand tingling which may indicated ulnar neuropathy.  NCS/EMG of the right arm will be ordered to evaluate this.    Further recommendations pending results.  ------------------------------------------------------------- History of present illness: Starting around May 2024, she has fallen ~ 4 times.  She reports having two falls while going down steps in the garage. With her last fall, she recalls being dizzy prior to falling.  She need help to stand after her fall.  She has fallen on her right arm which is very painful.  She also complains of tingling in the right 4th and 5th finger.  Right arm pain started in the back/shoulder region and radiates down her arm.  Pain is achy and throbbing.  She went to the ER on 6/1 where CT head, cervical spine, and thoracic spine were performed.   There is multilevel degenerative changes in the cervical spine at C5-6.  No abnormalities in the thoracic spine or brain.   She has some numbness in the right leg.    Out-side paper records, electronic medical record, and images have been reviewed where available and summarized as:  CT cervical spine 07/14/2022: 1. No acute fracture or traumatic listhesis of the cervical spine. 2. Moderate multilevel degenerative disc disease, most pronounced at C5-6.  CT thoracic spine wo contrast 07/14/2022: No acute bony abnormality.   CT head 07/14/2022: No acute traumatic injury identified. Normal for age noncontrast CT appearance of the brain.  Lab Results  Component Value Date   HGBA1C 7.1 (H) 06/14/2020   No results found for: "VITAMINB12" Lab Results  Component Value Date   TSH 1.240 05/18/2020   Lab Results  Component Value Date   ESRSEDRATE 4 07/14/2020    Past Medical History:  Diagnosis Date   Back pain    Chronic back pain    Chronic neck pain    Diabetes (HCC)    Edema of both lower extremities    Hypothyroidism    Joint pain    Lactose intolerance    Sleep apnea    Thyroid disease     Past Surgical History:  Procedure Laterality Date   THYROID SURGERY     TUBAL LIGATION       Medications:  Outpatient Encounter Medications as of 08/14/2022  Medication Sig   acetaminophen (TYLENOL) 325 MG tablet Take 2 tablets (650  mg total) by mouth every 6 (six) hours as needed.   cholecalciferol (VITAMIN D3) 25 MCG (1000 UNIT) tablet Take 1,000 Units by mouth daily.   cyclobenzaprine (FLEXERIL) 10 MG tablet Take 10 mg by mouth 3 (three) times daily.   ibuprofen (ADVIL) 600 MG tablet Take 1 tablet (600 mg total) by mouth every 6 (six) hours as needed.   ketorolac (TORADOL) 10 MG tablet Take 10 mg by mouth 4 (four) times daily.   levothyroxine (SYNTHROID) 137 MCG tablet Take 1 tablet (137 mcg total) by mouth daily before breakfast.   lidocaine (LIDODERM) 5 % Place 1 patch onto the  skin daily as needed. Remove & Discard patch within 12 hours or as directed by MD   [DISCONTINUED] COVID-19 At Home Antigen Test (BINAXNOW COVID-19 AG HOME TEST) KIT Use as directed.   [DISCONTINUED] Elderberry 500 MG CAPS Take by mouth.   [DISCONTINUED] empagliflozin (JARDIANCE) 10 MG TABS tablet Take 1 tablet (10 mg total) by mouth daily before breakfast. (Patient not taking: Reported on 10/31/2021)   [DISCONTINUED] meloxicam (MOBIC) 7.5 MG tablet Take 7.5 mg by mouth 2 (two) times daily.   [DISCONTINUED] oxyCODONE (ROXICODONE) 5 MG immediate release tablet Take 1 tablet (5 mg total) by mouth every 4 (four) hours as needed for severe pain.   No facility-administered encounter medications on file as of 08/14/2022.    Allergies:  Allergies  Allergen Reactions   Codeine Swelling    Pt states Facial swelling    Family History: Family History  Problem Relation Age of Onset   Diabetes Mother    High blood pressure Mother    Stroke Mother    Kidney disease Mother    Sleep apnea Mother    Obesity Mother    Stroke Father    Aneurysm Father    Healthy Paternal Aunt    Stroke Maternal Grandmother        Mini strokes   Diabetes Maternal Grandmother    Epilepsy Maternal Grandfather     Social History: Social History   Tobacco Use   Smoking status: Former   Smokeless tobacco: Never  Building services engineer Use: Never used  Substance Use Topics   Alcohol use: Not Currently   Drug use: Never   Social History   Social History Narrative   Are you right handed or left handed? Right Handed    Are you currently employed ? yes   What is your current occupation?Work for [employer] you live at home alone? No    Who lives with you? Husband    What type of home do you live in: 1 story or 2 story? One story home         Vital Signs:  BP (!) 140/83   Pulse 83   Ht 5\' 6"  (1.676 m)   Wt 207 lb (93.9 kg)   SpO2 91%   BMI 33.41 kg/m     Neurological Exam: MENTAL STATUS  including orientation to time, place, person, recent and remote memory, attention span and concentration, language, and fund of knowledge is normal.  Speech is not dysarthric.  CRANIAL NERVES: II:  No visual field defects.     III-IV-VI: Pupils equal round and reactive to light.  Normal conjugate, extra-ocular eye movements in all directions of gaze.  No nystagmus.  No ptosis.   V:  Normal facial sensation.    VII:  Normal facial symmetry and movements.   VIII:  Normal hearing  and vestibular function.   IX-X:  Normal palatal movement.   XI:  Normal shoulder shrug and head rotation.   XII:  Normal tongue strength and range of motion, no deviation or fasciculation.  MOTOR:  She is holding her right hand/arm against her chest because extending the arm exacerbates her pain.  Motor strength testing of the right arm is limited by pain, but there is 5/5 motor strength with deltoid, biceps, triceps, and 4/5 strength in the hand with grip.  Right foot motor strength testing was inconsistent and may have some weakness (4/5), but it was difficult to determine this because of effort.  Left arm and leg is 5/5. No atrophy, fasciculations or abnormal movements.  No pronator drift.   MSRs:                                           Right        Left brachioradialis 2+  2+  biceps 2+  2+  triceps 2+  2+  patellar 2+  2+  ankle jerk 2+  2+  Hoffman no  no  plantar response down  down   SENSORY:  Normal and symmetric perception of light touch, pinprick, vibration, and temperature.   COORDINATION/GAIT: Normal finger-to- nose-finge.  Intact rapid alternating movements bilaterally.  Gait narrow based and stable.  Stressed gait is effortful, but able to perform.  Tandem gait intact.    Thank you for allowing me to participate in patient's care.  If I can answer any additional questions, I would be pleased to do so.    Sincerely,    Shellene Sweigert K. Allena Katz, DO

## 2022-08-17 ENCOUNTER — Ambulatory Visit: Payer: 59 | Admitting: Neurology

## 2022-08-17 DIAGNOSIS — M79601 Pain in right arm: Secondary | ICD-10-CM

## 2022-08-17 DIAGNOSIS — R296 Repeated falls: Secondary | ICD-10-CM

## 2022-08-17 DIAGNOSIS — R200 Anesthesia of skin: Secondary | ICD-10-CM

## 2022-08-17 NOTE — Procedures (Signed)
  [facility]  [address], [address]  [place], [place] [address] Tel: [phone] Fax: [phone] Test Date:  08/17/2022  Patient: Donna Cowan DOB: 06/29/1965 Physician: Nita Sickle, DO  Sex: Female Height: 5\' 6"  Ref Phys: Nita Sickle, DO  ID#: [id number]   Technician:    History: Is a 57 year old female referred for evaluation of right arm pain and hand paresthesias.  NCV & EMG Findings: Electrodiagnostic testing was limited and prematurely terminated due to pain.  Findings show: Right median sensory response is mildly prolonged (3.7 ms).  Right ulnar sensory responses within normal limits.  Right median motor response was incompletely evaluated as testing was stopped due to pain.   Impression: This is an incomplete study, as testing was prematurely terminated due to pain.  No meaningful conclusions can be made.   ___________________________ Nita Sickle, DO    Nerve Conduction Studies   Stim Site NR Peak (ms) Norm Peak (ms) O-P Amp (V) Norm O-P Amp  Right Median Anti Sensory (2nd Digit)  32 C  Wrist    *3.7 <3.6 18.9 >15  Right Ulnar Anti Sensory (5th Digit)  32 C  Wrist    2.8 <3.1 29.8 >10     Stim Site NR Onset (ms) Norm Onset (ms) O-P Amp (mV) Norm O-P Amp Site1 Site2 Delta-0 (ms) Dist (cm) Vel (m/s) Norm Vel (m/s)  Right Median Motor (Abd Poll Brev)  32 C  Wrist    3.6 <4.0 10.5 >6 Elbow Wrist 5.3 0.0  >50  Elbow    8.9  3.1             Waveforms:

## 2022-08-21 ENCOUNTER — Ambulatory Visit (INDEPENDENT_AMBULATORY_CARE_PROVIDER_SITE_OTHER): Payer: 59 | Admitting: Neurology

## 2022-08-21 ENCOUNTER — Ambulatory Visit
Admission: RE | Admit: 2022-08-21 | Discharge: 2022-08-21 | Disposition: A | Discharge status: Home / Self Care | Payer: 59 | Source: Ambulatory Visit | Attending: Neurology | Admitting: Neurology

## 2022-08-21 DIAGNOSIS — R200 Anesthesia of skin: Secondary | ICD-10-CM

## 2022-08-21 DIAGNOSIS — R202 Paresthesia of skin: Secondary | ICD-10-CM | POA: Diagnosis not present

## 2022-08-21 DIAGNOSIS — R296 Repeated falls: Secondary | ICD-10-CM

## 2022-08-21 DIAGNOSIS — M79601 Pain in right arm: Secondary | ICD-10-CM

## 2022-08-21 NOTE — Procedures (Signed)
  [facility]  [address], [address]  [place], [place] [address] Tel: [phone] Fax: [phone] Test Date:  08/21/2022  Patient: Donna Cowan DOB: 1965/02/19 Physician: Jacquelyne Balint, MD  Sex: Female Height: 5\' 6"  Ref Phys: Nita Sickle, DO  ID#: [id number]   Technician:    History: This is a 57 year old female with right arm paresthesia.  Findings: High frequency (4.0-16.0 MHz) B-mode, nonvascular ultrasound of the right upper limb shows: Cross sectional areas (CSA) of the right median (palm to mid upper arm) and right ulnar (wrist to mid upper arm) nerves are within normal limits. Wrist to forearm ratio of right median nerve is increased (1.76). There is no subluxation or dislocation of the right ulnar nerve from the ulnar groove with maximal elbow flexion.  Impression: This is a neuromuscular ultrasound of the right upper limb. The findings are most consistent with the following: Evidence of possible entrapment of the right median nerve at the wrist segment, based on an increased ratio comparing the cross sectional area (CSA) measurement of the median nerve at the wrist to the forearm. Although this finding may be seen in carpal tunnel syndrome, the more established criteria for ultrasonographic diagnosis (i.e., CSA> 13 mm2) is not present. No definite evidence of entrapment/repetitive nerve trauma of the right ulnar nerve at the elbow segment. There is focal enlargement at the elbow, but it does not meet diagnostic criteria for evidence of entrapment. There is no subluxation or dislocation of the right ulnar nerve from the ulnar groove with maximal elbow flexion. No other obvious lesion involving the adjacent bone or tendon is identified. No definite vascular abnormalities.   ____________  Jacquelyne Balint, MD [facility]    Nerve Measurements   Site Area Segment Area Ratio Mobility Vascularity Comment   mm Norm   Norm     Right Median  Palm 5.7          Wrist 8.6  < 13.0         Forearm 4.9  < 10.7  Wrist - Forearm 1.76  < 1.50      Pronator teres 4.6  < 11.0         Mid-arm 7.9  < 13.1         Right Ulnar  Wrist 2.5  < 10.0         Forearm 3.2  < 10.0  Elbow - Forearm 2.88      Distal elbow 6.2  < 10.0  Elbow - mid-arm 1.26      Elbow 9.2  < 10.0         Proximal elbow 5.7  < 10.0         Mid-arm 7.3  < 10.0          Ultrasound Images:

## 2022-08-24 ENCOUNTER — Telehealth: Payer: Self-pay | Admitting: Neurology

## 2022-08-24 NOTE — Telephone Encounter (Signed)
Pt returned the call to the office. 

## 2022-08-24 NOTE — Telephone Encounter (Signed)
Called patient and informed her that we can send her u/s results to whomever is completing her FMLA paperwork. Patient stated that she will call us back with the information.

## 2022-08-29 ENCOUNTER — Encounter: Payer: Self-pay | Admitting: Physical Therapy

## 2022-08-29 ENCOUNTER — Other Ambulatory Visit: Payer: Self-pay

## 2022-08-29 ENCOUNTER — Ambulatory Visit: Payer: 59 | Attending: Neurology | Admitting: Physical Therapy

## 2022-08-29 DIAGNOSIS — R296 Repeated falls: Secondary | ICD-10-CM | POA: Diagnosis not present

## 2022-08-29 DIAGNOSIS — R202 Paresthesia of skin: Secondary | ICD-10-CM | POA: Insufficient documentation

## 2022-08-29 DIAGNOSIS — M79601 Pain in right arm: Secondary | ICD-10-CM | POA: Insufficient documentation

## 2022-08-29 DIAGNOSIS — M25511 Pain in right shoulder: Secondary | ICD-10-CM | POA: Diagnosis present

## 2022-08-29 DIAGNOSIS — M5459 Other low back pain: Secondary | ICD-10-CM | POA: Diagnosis present

## 2022-08-29 DIAGNOSIS — R2681 Unsteadiness on feet: Secondary | ICD-10-CM | POA: Diagnosis present

## 2022-08-29 DIAGNOSIS — R200 Anesthesia of skin: Secondary | ICD-10-CM | POA: Diagnosis not present

## 2022-08-29 DIAGNOSIS — M6281 Muscle weakness (generalized): Secondary | ICD-10-CM | POA: Diagnosis present

## 2022-08-29 NOTE — Therapy (Signed)
OUTPATIENT PHYSICAL THERAPY THORACOLUMBAR EVALUATION  Patient Name: Donna Cowan MRN: [id number] DOB:Jul 06, 1965, 57 y.o., female Today's Date: 08/29/2022   PT End of Session - 08/29/22 1519     Visit Number 1    Number of Visits --   1-2x/week   Date for PT Re-Evaluation 10/24/22    Authorization Type [employer] - TAKE FOTO VISIT 2    PT Start Time 1405    PT Stop Time 1452    PT Time Calculation (min) 47 min             Past Medical History:  Diagnosis Date   Back pain    Chronic back pain    Chronic neck pain    Diabetes (HCC)    Edema of both lower extremities    Hypothyroidism    Joint pain    Lactose intolerance    Sleep apnea    Thyroid disease    Past Surgical History:  Procedure Laterality Date   THYROID SURGERY     TUBAL LIGATION     Patient Active Problem List   Diagnosis Date Noted   Anger reaction 05/09/2021   Family discord 05/09/2021   PTSD (post-traumatic stress disorder) 05/09/2021   Chronic right-sided headache 07/19/2020   Right sided temporal headache 07/14/2020   Carpal tunnel syndrome 06/20/2020   Leg swelling 06/20/2020   Ankle strain 06/20/2020   Tinnitus of right ear 05/19/2020   Class 1 obesity due to excess calories with body mass index (BMI) of 31.0 to 31.9 in adult 08/12/2019   Bereavement reaction 03/29/2019   Type 2 diabetes mellitus without complications (HCC) 03/29/2019   Graves disease 03/29/2019   Healthcare maintenance 03/29/2019   Vitamin D deficiency 03/29/2019   Back injury, sequela 03/29/2019    PCP: Leilani Able, MD  REFERRING PROVIDER: Glendale Chard, DO  THERAPY DIAG:  Right shoulder pain, unspecified chronicity - Plan: PT plan of care cert/re-cert  Muscle weakness - Plan: PT plan of care cert/re-cert  Unsteadiness on feet - Plan: PT plan of care cert/re-cert  Other low back pain - Plan: PT plan of care cert/re-cert  REFERRING DIAG: Balance problem [R26.89], Falls frequently [R29.6], Right arm pain  [M79.601]   Rationale for Evaluation and Treatment:  Rehabilitation  SUBJECTIVE:  PERTINENT PAST HISTORY:  TII DM, graves disease, back pain, carpal tunnel        PRECAUTIONS: Fall  WEIGHT BEARING RESTRICTIONS No  FALLS:  Has patient fallen in last 6 months? Yes, Number of falls: 4x - started with a fall down steps (unsure of cause), fell into truck and hurt R arm, fell into wall, fell out of chair  MOI/History of condition:  Onset date: May 18  SUBJECTIVE STATEMENT  Donna Cowan is a 57 y.o. female who presents to clinic with chief complaint of R shoulder pain and balance issues.  Pt reports balance problems which started May 18th with a fall down stairs.  She feels her balance was "off" before this but had no major issues.  She fell again several days later and caught the fall against a truck and hurt her R shoulder  This pain can extend through her R UE.  She denies n/t except in fingers (carpel tunnel per pt). Neurologist has ruled out myelopathy, neuropathy, and myopathy.  She has had nerve conduction study and ultrasound which shows no red flag pathology.  She can have days where her balance is fairly good and days it is poor for no clear reason.  She denies dizziness, LOC, or lightheadedness prior to falls.  She has had an MRI of the brain which has not been read.  From referring provider: "Falls, unclear etiology. There is no evidence of myelopathy, neuropathy, or myopathy. She retains consciousness making seizures unlikely. Her exam shows mild right arm and leg weakness with normal reflexes and sensation. To further evaluation, I will order MRI brain wo contrast. Going forward, consider MRI lumbar spine."    Red flags:  Unexplained balance deficits  Pain:  Are you having pain? 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 No acute fracture or traumatic listhesis of the cervical spine. 2. Moderate multilevel degenerative disc disease, most pronounced at C5-6.  GENERAL OBSERVATION/GAIT:  Unsteady gait, forward trunk lean  SENSATION:  Light touch: Appears intact with exception of digits 4 and 5 R hand   UPPER EXTREMITY A/P ROM:  ROM Right (Eval) Left (Eval)  Shoulder flexion 90*/110* 130  Shoulder abduction 80*/100* 120  Shoulder internal rotation    Shoulder external rotation    Functional IR R hip* L3  Functional ER R ear* C7  Shoulder extension    Elbow extension    Elbow flexion     (Blank rows = not tested, N = WNL, * = concordant pain with testing)     LE MMT:  MMT Right (Eval) Left (Eval)  Hip flexion (L2, L3) c c  Knee extension (L3) c c  Knee flexion    Hip abduction    Hip extension    Hip external rotation    Hip internal rotation    Hip adduction    Ankle dorsiflexion (L4) c c  Ankle plantarflexion (S1) c c  Ankle inversion    Ankle eversion    Great Toe ext (L5) c c  Grossly     (Blank rows = not tested, score listed is out of 5 possible points.  N = WNL, D = diminished, C = clear for gross weakness with myotome testing, * = concordant pain with testing)   UPPER EXTREMITY MMT:  MMT Right 08/29/2022 Left 08/29/2022  Shoulder flexion P! n  Shoulder abduction (C5) P! n  Shoulder ER 3+* n  Shoulder IR 3+* n  Middle trapezius    Lower trapezius    Shoulder extension    Grip strength    Shoulder shrug (C4)    Elbow flexion (C6)    Elbow ext (C7)    Thumb ext (C8)    Finger abd (T1)    Grossly     (Blank rows = not tested,  score listed is out of 5 possible points.  N = WNL, D = diminished, C = clear for gross weakness with myotome testing, * = concordant pain with testing)   Functional Tests  Eval (08/29/2022)    30'' STS: 4x  UE used? n    BERG BALANCE  Sitting to Standing: Numbers; 0-4: 3  4. Stands without using hands and stabilize independently  3. Stands independently using hands  2. Stands using hands after multiple  trials  1. Min A to stand  0. Mod-Max A to stand Standing unsupported: Numbers; 0-4: 4  4. Stands safely for 2 minutes  3. Stands 2 minutes with supervision  2. Stands 30 seconds unsupported  1. Needs several tries to stand unsupported for 30 seconds  0. Unable to stand unsupported for 30 seconds Sitting unsupported: Numbers; 0-4: 4  4. Sits for 2 minutes independently  3. Sits for 2 minutes with supervision  2. Able to sit 30 seconds  1. Able to sit 10 seconds  0. Unable to sit for 10 seconds Standing to Sitting: Numbers; 0-4: 2 4. Sits safely with minimal use of hands 3. Controls descent with hands 2. Uses back of legs against chair to control descent 1. Sits independently, but uncontrolled descent 0. Needs assistance Transfers: Numbers; 0-4: 3  4. Transfers safely with minor use of hands  3. Transfers safely definite use of hands  2. Transfers with verbal cueing/supervision  1. Needs 1 person assist  0. Needs 2 person assist  Standing with eyes closed: Numbers; 0-4: 4  4. Stands safely for 10 seconds  3. Stands 10 seconds with supervision   2. Able to stand for 3 seconds  1. Unable to keep eyes closed for 3 seconds, but is safe  0. Needs assist to keep from falling Standing with feet together: Numbers; 0-4: 4 4. Stands for 1 minute safely 3. Stands for 1 minute with supervision 2. Unable to hold for 30 seconds  1. Needs help to attain position but can hold for 15 seconds  0. Needs help to attain position and unable to hold for 15 seconds Reaching forward with  outstretched arm: Numbers; 0-4: 3  4. Reaches forward 10 inches  3. Reaches forward 5 inches  2. Reaches forward 2 inches  1. Reaches forward with supervision  0. Loses balance/requires assistace Retrieving object from the floor: Numbers; 0-4: 3 4. Able to pick up easily and safely 3. Able to pick up with supervision 2. Unable to pick up, but reaches within 1-2 inches independently 1. Unable to pick up and needs supervision 0. Unable/needs assistance to keep from falling  Turning to look behind: Numbers; 0-4: 2  4. Looks behind from both sides and weight shifts well  3. Looks behind one side only, other side less weight shift  2. Turns sideways only, maintains balance  1. Needs supervision when turning  0. Needs assistance  Turning 360 degrees: Numbers; 0-4: 2  4. Able to turn in </=4 seconds  3. Able to turn on one side in </= 4 seconds   2. Able to turn slowly, but safely  1. Needs supervision or verbal cueing  0. Needs assistance Place alternate foot on stool: Numbers; 0-4: 4 4. Completes 8 steps in 20 seconds 3. Completes 8 steps in >20 seconds 2. 4 steps without assistance/supervision 1. Completes >2 steps with minimal assist 0. Unable, needs assist to keep from falling Standing with one foot in front: Numbers; 0-4: 0  4. Independent tandem for 30 seconds  3. Independent foot ahead for 30 seconds  2. Independent small step for 30 seconds  1. Needs help to step, but can hold for 15 seconds  0. Loses balance while standing/stepping Standing on one foot: Numbers; 0-4: 2 4. Holds >10 seconds 3. Holds 5-10 seconds 2. Holds >/=3 seconds  1. Holds <3 seconds 0. Unable   Total Score: 40/56  PALPATION:   TTP R UT, Infra and supraspinatus muscle belly   R/C test cluster (+)  SPECIAL TESTS:  Spurling's (-) for UE sxs  PATIENT SURVEYS:  TAKE VISIT 2   TODAY'S TREATMENT  NA  PATIENT EDUCATION:   POC, diagnosis, and prognosis.  Pt educated via explanation, demonstration, and handout (HEP).  Pt confirms understanding verbally.   HOME EXERCISE PROGRAM: NA  ASSESSMENT:  CLINICAL IMPRESSION: Alexine is a 57 y.o. female who presents to clinic with signs and sxs consistent with R shoulder pain and balance issues.  Etiology of past falls is unclear.  She does show balance deficit and fall risk per berg @ 40/56 but this does not adequately explain her sudden loss of balance multiple times, including falling out of a chair while leaning over.  This is being explored by neurology and an MRI of her brain has been completed, but not read.  DD for R shoulder and UE pain includes SAPS/rotator cuff and cervical radicular pain.  Ruling up SAPs with likely R/C involvement given traumatic fall and (+) r/c test cluster.  Ruling down nerve root irritation with (-) spurling's and no change in pain with neck movements.  Advised she may want to get an appt with ortho for her shoulder.  Will follow up in [place] at pts request.  OBJECTIVE IMPAIRMENTS: Pain, shoulder ROM, shoulder strength, balance  ACTIVITY LIMITATIONS: work, housework, lifting, reaching  PERSONAL FACTORS: See medical history and pertinent history   REHAB POTENTIAL: Fair unclear etiology of balance problem  CLINICAL DECISION MAKING: Evolving/moderate complexity  EVALUATION COMPLEXITY: Moderate   GOALS:   SHORT TERM GOALS: Target date: 09/26/2022   Glenyce will be >75% HEP compliant to improve carryover between sessions and facilitate independent management of condition  Evaluation: ongoing Goal status: INITIAL   LONG TERM GOALS: Target date: 10/24/2022   Kanaya will improve FOTO score to goal as a proxy for functional improvement  Evaluation/Baseline: TAKE VISIT 2 Goal status: INITIAL    2.  Areesha will imporve BERG balance score to 47 pts, to show a significant improvement in balance in order to reduce  fall risk  Evaluation/Baseline: 40 pts Goal status: INITIAL  See interpretation below:      3.  Ninetta will improve 30'' STS (MCID 2) to >/= 8x (w/ UE?: Y) to show improved LE strength and improved transfers   Evaluation/Baseline: 4x  w/ UE? Y Goal status: INITIAL   4.  Jamita will self report >/= 50% decrease in shoulder pain from evaluation   Evaluation/Baseline: 10/10 max pain Goal status: INITIAL  5.  Gaylin will improve the following MMTs to >/= 4+/5 to show improvement in strength:     Evaluation/Baseline:  UPPER EXTREMITY MMT:  MMT Right 08/29/2022 Left 08/29/2022  Shoulder flexion P! n  Shoulder abduction (C5) P! n  Shoulder ER 3+* n  Shoulder IR 3+* n  Middle trapezius    Lower trapezius    Shoulder extension    Grip strength    Shoulder shrug (C4)    Elbow flexion (C6)    Elbow ext (C7)    Thumb ext (C8)    Finger abd (T1)    Grossly     (Blank rows = not tested, score listed is out of 5 possible points.  N = WNL, D = diminished, C = clear for gross weakness with myotome testing, * = concordant pain with testing)  Goal status: INITIAL    PLAN: PT FREQUENCY: 1-2x/week  PT  DURATION: 8 weeks  PLANNED INTERVENTIONS: Therapeutic exercises, Aquatic therapy, Therapeutic activity, Neuro Muscular re-education, Gait training, Patient/Family education, Joint mobilization, Dry Needling, Electrical stimulation, Spinal mobilization and/or manipulation, Moist heat, Taping, Vasopneumatic device, Ionotophoresis 4mg /ml Dexamethasone, and Manual therapy   Alphonzo Severance PT, DPT 08/29/2022, 3:33 PM

## 2022-08-30 ENCOUNTER — Other Ambulatory Visit: Payer: Self-pay

## 2022-08-30 DIAGNOSIS — R296 Repeated falls: Secondary | ICD-10-CM

## 2022-08-31 ENCOUNTER — Ambulatory Visit: Payer: 59

## 2022-08-31 DIAGNOSIS — M5459 Other low back pain: Secondary | ICD-10-CM

## 2022-08-31 DIAGNOSIS — M6281 Muscle weakness (generalized): Secondary | ICD-10-CM

## 2022-08-31 DIAGNOSIS — M25511 Pain in right shoulder: Secondary | ICD-10-CM

## 2022-08-31 DIAGNOSIS — R2681 Unsteadiness on feet: Secondary | ICD-10-CM

## 2022-08-31 NOTE — Therapy (Signed)
OUTPATIENT PHYSICAL THERAPY TREATMENT  Patient Name: Donna Cowan MRN: [id number] DOB:[birth year], 57 y.o., female Today's Date: 08/31/2022   PT End of Session - 08/31/22 1033     Visit Number 2    Number of Visits --   1-2x/week   Date for PT Re-Evaluation 10/24/22    Authorization Type [employer]    PT Start Time 0933    PT Stop Time 1018    PT Time Calculation (min) 45 min    Activity Tolerance Patient limited by pain    Behavior During Therapy [facility] for tasks assessed/performed              Past Medical History:  Diagnosis Date   Back pain    Chronic back pain    Chronic neck pain    Diabetes (HCC)    Edema of both lower extremities    Hypothyroidism    Joint pain    Lactose intolerance    Sleep apnea    Thyroid disease    Past Surgical History:  Procedure Laterality Date   THYROID SURGERY     TUBAL LIGATION     Patient Active Problem List   Diagnosis Date Noted   Anger reaction 05/09/2021   Family discord 05/09/2021   PTSD (post-traumatic stress disorder) 05/09/2021   Chronic right-sided headache 07/19/2020   Right sided temporal headache 07/14/2020   Carpal tunnel syndrome 06/20/2020   Leg swelling 06/20/2020   Ankle strain 06/20/2020   Tinnitus of right ear 05/19/2020   Class 1 obesity due to excess calories with body mass index (BMI) of 31.0 to 31.9 in adult 08/12/2019   Bereavement reaction 03/29/2019   Type 2 diabetes mellitus without complications (HCC) 03/29/2019   Graves disease 03/29/2019   Healthcare maintenance 03/29/2019   Vitamin D deficiency 03/29/2019   Back injury, sequela 03/29/2019    PCP: Leilani Able, MD  REFERRING PROVIDER: Glendale Chard, DO  THERAPY DIAG:  Right shoulder pain, unspecified chronicity  Muscle weakness  Unsteadiness on feet  Other low back pain  REFERRING DIAG: Balance problem [R26.89], Falls frequently [R29.6], Right arm pain [M79.601]   Rationale for Evaluation and Treatment:   Rehabilitation  SUBJECTIVE:  PERTINENT PAST HISTORY:  TII DM, graves disease, back pain, carpal tunnel        PRECAUTIONS: Fall  WEIGHT BEARING RESTRICTIONS No  FALLS:  Has patient fallen in last 6 months? Yes, Number of falls: 4x - started with a fall down steps (unsure of cause), fell into truck and hurt R arm, fell into wall, fell out of chair  MOI/History of condition:  Onset date: May 18  SUBJECTIVE STATEMENT  Pt reports R shoulder pain, also got a recent dx of carpal tunnel in R wrist.  From referring provider: "Falls, unclear etiology. There is no evidence of myelopathy, neuropathy, or myopathy. She retains consciousness making seizures unlikely. Her exam shows mild right arm and leg weakness with normal reflexes and sensation. To further evaluation, I will order MRI brain wo contrast. Going forward, consider MRI lumbar spine."    Red flags:  Unexplained balance deficits  Pain:  Are you having pain? 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 No acute fracture or traumatic listhesis of the cervical spine. 2. Moderate multilevel degenerative disc disease, most pronounced at C5-6.  GENERAL OBSERVATION/GAIT:  Unsteady gait, forward trunk lean  SENSATION:  Light touch: Appears intact with exception of digits 4 and 5 R hand   UPPER EXTREMITY A/P ROM:  ROM Right (Eval) Left (Eval)  Shoulder flexion 90*/110* 130  Shoulder abduction 80*/100* 120  Shoulder internal rotation    Shoulder external  rotation    Functional IR R hip* L3  Functional ER R ear* C7  Shoulder extension    Elbow extension    Elbow flexion     (Blank rows = not tested, N = WNL, * = concordant pain with testing)     LE MMT:  MMT Right (Eval) Left (Eval)  Hip flexion (L2, L3) c c  Knee extension (L3) c c  Knee flexion    Hip abduction    Hip extension    Hip external rotation    Hip internal rotation    Hip adduction    Ankle dorsiflexion (L4) c c  Ankle plantarflexion (S1) c c  Ankle inversion    Ankle eversion    Great Toe ext (L5) c c  Grossly     (Blank rows = not tested, score listed is out of 5 possible points.  N = WNL, D = diminished, C = clear for gross weakness with myotome testing, * = concordant pain with testing)   UPPER EXTREMITY MMT:  MMT Right 08/29/2022 Left 08/29/2022  Shoulder flexion P! n  Shoulder abduction (C5) P! n  Shoulder ER 3+* n  Shoulder IR 3+* n  Middle trapezius    Lower trapezius    Shoulder extension    Grip strength    Shoulder shrug (C4)    Elbow flexion (C6)    Elbow ext (C7)    Thumb ext (C8)    Finger abd (T1)    Grossly     (Blank rows = not tested, score listed is out of 5 possible points.  N = WNL, D = diminished, C = clear for gross weakness with myotome testing, * = concordant pain with testing)   Functional Tests  Eval (08/29/2022)    30'' STS: 4x  UE used? n    BERG BALANCE  Sitting to Standing: Numbers; 0-4: 3  4. Stands without using hands and stabilize independently  3. Stands independently using hands  2. Stands using hands after multiple trials  1. Min A to stand  0. Mod-Max A to stand Standing unsupported: Numbers; 0-4: 4  4. Stands safely for 2 minutes  3. Stands 2 minutes with supervision  2. Stands 30 seconds unsupported  1. Needs several tries to stand unsupported for 30 seconds  0. Unable to stand unsupported for 30 seconds Sitting unsupported: Numbers; 0-4: 4  4. Sits for 2 minutes independently  3. Sits  for 2 minutes with supervision  2. Able to sit 30 seconds  1. Able to sit 10 seconds  0. Unable to sit for 10 seconds Standing to Sitting: Numbers; 0-4: 2 4. Sits safely with minimal use of hands 3. Controls descent with hands 2. Uses back of legs against chair to control descent 1. Sits independently, but uncontrolled descent 0. Needs assistance Transfers: Numbers; 0-4: 3  4. Transfers safely with minor use of hands  3. Transfers safely definite use of hands  2. Transfers with verbal cueing/supervision  1. Needs 1 person assist  0. Needs 2 person assist  Standing with eyes closed: Numbers; 0-4: 4  4. Stands safely for 10 seconds  3. Stands 10 seconds with supervision   2. Able to stand for 3 seconds  1. Unable to keep eyes closed for 3 seconds, but is safe  0. Needs assist to keep from falling Standing with feet together: Numbers; 0-4: 4 4. Stands for 1 minute safely 3. Stands for 1 minute with supervision 2. Unable to hold for 30 seconds  1. Needs help to attain position but can hold for 15 seconds  0. Needs help to attain position and unable to hold for 15 seconds Reaching forward with outstretched arm: Numbers; 0-4: 3  4. Reaches forward 10 inches  3. Reaches forward 5 inches  2. Reaches forward 2 inches  1. Reaches forward with supervision  0. Loses balance/requires assistace Retrieving object from the floor: Numbers; 0-4: 3 4. Able to pick up easily and safely 3. Able to pick up with supervision 2. Unable to pick up, but reaches within 1-2 inches independently 1. Unable to pick up and needs supervision 0. Unable/needs assistance to keep from falling  Turning to look behind: Numbers; 0-4: 2  4. Looks behind from both sides and weight shifts well  3. Looks behind one side only, other side less weight shift  2. Turns sideways only, maintains balance  1. Needs supervision when turning  0. Needs assistance  Turning 360 degrees: Numbers; 0-4: 2  4. Able to turn in </=4  seconds  3. Able to turn on one side in </= 4 seconds   2. Able to turn slowly, but safely  1. Needs supervision or verbal cueing  0. Needs assistance Place alternate foot on stool: Numbers; 0-4: 4 4. Completes 8 steps in 20 seconds 3. Completes 8 steps in >20 seconds 2. 4 steps without assistance/supervision 1. Completes >2 steps with minimal assist 0. Unable, needs assist to keep from falling Standing with one foot in front: Numbers; 0-4: 0  4. Independent tandem for 30 seconds  3. Independent foot ahead for 30 seconds  2. Independent small step for 30 seconds  1. Needs help to step, but can hold for 15 seconds  0. Loses balance while standing/stepping Standing on one foot: Numbers; 0-4: 2 4. Holds >10 seconds 3. Holds 5-10 seconds 2. Holds >/=3 seconds  1. Holds <3 seconds 0. Unable   Total Score: 40/56                                                         PALPATION:   TTP R UT, Infra and supraspinatus muscle belly   R/C test cluster (+)  SPECIAL TESTS:  Spurling's (-) for UE sxs  PATIENT SURVEYS:  FOTO:   TODAY'S TREATMENT  08/31/22 Manual Therapy: to decrease muscle spasm, pain and improve mobility STM to R UT,LS, middle deltoid in sitting  Therapeutic Exercise: to improve strength and mobility.  Demo, verbal and tactile cues throughout for technique.  Seated R UT and LS stretch 2x10" R wrist ext and flex stretch 2x10" Shoulder rolls x 5 - spasm in R triceps afterward  Moist heat to R shoulder and neck x 8  min   PATIENT EDUCATION:  POC, diagnosis, and prognosis.  Pt educated via explanation, demonstration, and handout (HEP).  Pt confirms understanding verbally.   HOME EXERCISE PROGRAM: Access Code: [email or web address] URL: [email or web address] Date: 08/31/2022 Prepared by: Verta Ellen  Exercises - Seated Upper Trapezius Stretch  - 1 x daily - 7 x weekly - 3 sets - 10 sec hold - Gentle Levator Scapulae Stretch  - 1 x daily - 7  x weekly - 3 sets - 10 sec hold - Seated Wrist Flexion Stretch  - 1 x daily - 7 x weekly - 3 sets - 10 sec hold - Seated Wrist Extension Stretch  - 1 x daily - 7 x weekly - 3 sets - 10 sec hold  ASSESSMENT:  CLINICAL IMPRESSION: Zakeya arrives today with high pain levels. She recently found out that she has carpal tunnel in R wrist. She was unable to do many movements with her R shoulder today. She was able to UT, LS stretching with little pain, shoulder rolls caused a spasm in her R tricep area on the last rep. Most of today's session was manual therapy to address muscle tension, she was very tight in the UT, LS, and middle deltoid. Did some moist heat to her neck and shoulder post session to address pain, this did help. I also gave her some stretches for carpal tunnel.  OBJECTIVE IMPAIRMENTS: Pain, shoulder ROM, shoulder strength, balance  ACTIVITY LIMITATIONS: work, housework, lifting, reaching  PERSONAL FACTORS: See medical history and pertinent history   REHAB POTENTIAL: Fair unclear etiology of balance problem  CLINICAL DECISION MAKING: Evolving/moderate complexity  EVALUATION COMPLEXITY: Moderate   GOALS:   SHORT TERM GOALS: Target date: 09/26/2022   Jaymarie will be >75% HEP compliant to improve carryover between sessions and facilitate independent management of condition  Evaluation: ongoing Goal status: INITIAL   LONG TERM GOALS: Target date: 10/24/2022   Saadia will improve FOTO score to goal as a proxy for functional improvement  Evaluation/Baseline: TAKE VISIT 2 Goal status: INITIAL    2.  Jaylee will imporve BERG balance score to 47 pts, to show a significant improvement in balance in order to reduce fall risk  Evaluation/Baseline: 40 pts Goal status: INITIAL  See interpretation below:      3.  Takela will improve 30'' STS (MCID 2) to >/= 8x (w/ UE?: Y) to show improved LE strength and improved transfers   Evaluation/Baseline: 4x   w/ UE? Y Goal status: INITIAL   4.  Jayleen will self report >/= 50% decrease in shoulder pain from evaluation   Evaluation/Baseline: 10/10 max pain Goal status: INITIAL  5.  Kemoni will improve the following MMTs to >/= 4+/5 to show improvement in strength:     Evaluation/Baseline:  UPPER EXTREMITY MMT:  MMT Right 08/29/2022 Left 08/29/2022  Shoulder flexion P! n  Shoulder abduction (C5) P! n  Shoulder ER 3+* n  Shoulder IR 3+* n  Middle trapezius    Lower trapezius    Shoulder extension    Grip strength    Shoulder shrug (C4)    Elbow flexion (C6)    Elbow ext (C7)    Thumb ext (C8)    Finger abd (T1)    Grossly     (Blank rows = not tested, score listed is out of 5 possible points.  N = WNL, D = diminished, C = clear for gross weakness with myotome testing, * = concordant pain with testing)  Goal status: INITIAL  PLAN: PT FREQUENCY: 1-2x/week  PT DURATION: 8 weeks  PLANNED INTERVENTIONS: Therapeutic exercises, Aquatic therapy, Therapeutic activity, Neuro Muscular re-education, Gait training, Patient/Family education, Joint mobilization, Dry Needling, Electrical stimulation, Spinal mobilization and/or manipulation, Moist heat, Taping, Vasopneumatic device, Ionotophoresis 4mg /ml Dexamethasone, and Manual therapy  PLAN FOR NEXT SESSION: FOTO; continue with manual and pain control; maybe R shoulder isometrics?  Darleene Cleaver, PTA  08/31/2022, 11:11 AM

## 2022-09-04 ENCOUNTER — Ambulatory Visit: Payer: 59

## 2022-09-04 ENCOUNTER — Other Ambulatory Visit: Payer: 59 | Admitting: Neurology

## 2022-09-04 DIAGNOSIS — M25511 Pain in right shoulder: Secondary | ICD-10-CM | POA: Diagnosis not present

## 2022-09-04 DIAGNOSIS — M6281 Muscle weakness (generalized): Secondary | ICD-10-CM

## 2022-09-04 DIAGNOSIS — R2681 Unsteadiness on feet: Secondary | ICD-10-CM

## 2022-09-04 DIAGNOSIS — M5459 Other low back pain: Secondary | ICD-10-CM

## 2022-09-04 NOTE — Therapy (Signed)
OUTPATIENT PHYSICAL THERAPY TREATMENT  Patient Name: Donna Cowan MRN: [id number] DOB:12-Apr-1965, 57 y.o., female Today's Date: 09/04/2022   PT End of Session - 09/04/22 1151     Visit Number 3    Number of Visits --   1-2x/week   Date for PT Re-Evaluation 10/24/22    Authorization Type [employer]    PT Start Time 1104    PT Stop Time 1200    PT Time Calculation (min) 56 min    Activity Tolerance Patient limited by pain    Behavior During Therapy [facility] for tasks assessed/performed               Past Medical History:  Diagnosis Date   Back pain    Chronic back pain    Chronic neck pain    Diabetes (HCC)    Edema of both lower extremities    Hypothyroidism    Joint pain    Lactose intolerance    Sleep apnea    Thyroid disease    Past Surgical History:  Procedure Laterality Date   THYROID SURGERY     TUBAL LIGATION     Patient Active Problem List   Diagnosis Date Noted   Anger reaction 05/09/2021   Family discord 05/09/2021   PTSD (post-traumatic stress disorder) 05/09/2021   Chronic right-sided headache 07/19/2020   Right sided temporal headache 07/14/2020   Carpal tunnel syndrome 06/20/2020   Leg swelling 06/20/2020   Ankle strain 06/20/2020   Tinnitus of right ear 05/19/2020   Class 1 obesity due to excess calories with body mass index (BMI) of 31.0 to 31.9 in adult 08/12/2019   Bereavement reaction 03/29/2019   Type 2 diabetes mellitus without complications (HCC) 03/29/2019   Graves disease 03/29/2019   Healthcare maintenance 03/29/2019   Vitamin D deficiency 03/29/2019   Back injury, sequela 03/29/2019    PCP: Leilani Able, MD  REFERRING PROVIDER: Glendale Chard, DO  THERAPY DIAG:  Right shoulder pain, unspecified chronicity  Muscle weakness  Unsteadiness on feet  Other low back pain  REFERRING DIAG: Balance problem [R26.89], Falls frequently [R29.6], Right arm pain [M79.601]   Rationale for Evaluation and Treatment:   Rehabilitation  SUBJECTIVE:  PERTINENT PAST HISTORY:  TII DM, graves disease, back pain, carpal tunnel        PRECAUTIONS: Fall  WEIGHT BEARING RESTRICTIONS No  FALLS:  Has patient fallen in last 6 months? Yes, Number of falls: 4x - started with a fall down steps (unsure of cause), fell into truck and hurt R arm, fell into wall, fell out of chair  MOI/History of condition:  Onset date: May 18  SUBJECTIVE STATEMENT Pt reports improved pain today.  From referring provider: "Falls, unclear etiology. There is no evidence of myelopathy, neuropathy, or myopathy. She retains consciousness making seizures unlikely. Her exam shows mild right arm and leg weakness with normal reflexes and sensation. To further evaluation, I will order MRI brain wo contrast. Going forward, consider MRI lumbar spine."    Red flags:  Unexplained balance deficits  Pain:  Are you having pain? 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 No acute fracture or traumatic listhesis of the cervical spine. 2. Moderate multilevel degenerative disc disease, most pronounced at C5-6.  GENERAL OBSERVATION/GAIT:  Unsteady gait, forward trunk lean  SENSATION:  Light touch: Appears intact with exception of digits 4 and 5 R hand   UPPER EXTREMITY A/P ROM:  ROM Right (Eval) Left (Eval)  Shoulder flexion 90*/110* 130  Shoulder abduction 80*/100* 120  Shoulder internal rotation    Shoulder external rotation    Functional IR R hip* L3  Functional  ER R ear* C7  Shoulder extension    Elbow extension    Elbow flexion     (Blank rows = not tested, N = WNL, * = concordant pain with testing)     LE MMT:  MMT Right (Eval) Left (Eval)  Hip flexion (L2, L3) c c  Knee extension (L3) c c  Knee flexion    Hip abduction    Hip extension    Hip external rotation    Hip internal rotation    Hip adduction    Ankle dorsiflexion (L4) c c  Ankle plantarflexion (S1) c c  Ankle inversion    Ankle eversion    Great Toe ext (L5) c c  Grossly     (Blank rows = not tested, score listed is out of 5 possible points.  N = WNL, D = diminished, C = clear for gross weakness with myotome testing, * = concordant pain with testing)   UPPER EXTREMITY MMT:  MMT Right 08/29/2022 Left 08/29/2022  Shoulder flexion P! n  Shoulder abduction (C5) P! n  Shoulder ER 3+* n  Shoulder IR 3+* n  Middle trapezius    Lower trapezius    Shoulder extension    Grip strength    Shoulder shrug (C4)    Elbow flexion (C6)    Elbow ext (C7)    Thumb ext (C8)    Finger abd (T1)    Grossly     (Blank rows = not tested, score listed is out of 5 possible points.  N = WNL, D = diminished, C = clear for gross weakness with myotome testing, * = concordant pain with testing)   Functional Tests  Eval (08/29/2022)    30'' STS: 4x  UE used? n    BERG BALANCE  Sitting to Standing: Numbers; 0-4: 3  4. Stands without using hands and stabilize independently  3. Stands independently using hands  2. Stands using hands after multiple trials  1. Min A to stand  0. Mod-Max A to stand Standing unsupported: Numbers; 0-4: 4  4. Stands safely for 2 minutes  3. Stands 2 minutes with supervision  2. Stands 30 seconds unsupported  1. Needs several tries to stand unsupported for 30 seconds  0. Unable to stand unsupported for 30 seconds Sitting unsupported: Numbers; 0-4: 4  4. Sits for 2 minutes independently  3. Sits for 2 minutes with supervision  2. Able to sit 30  seconds  1. Able to sit 10 seconds  0. Unable to sit for 10 seconds Standing to Sitting: Numbers; 0-4: 2 4. Sits safely with minimal use of hands 3. Controls descent with hands 2. Uses back of legs against chair to control descent 1. Sits independently, but uncontrolled descent 0. Needs assistance Transfers: Numbers; 0-4: 3  4. Transfers safely with minor use of hands  3. Transfers safely definite use of hands  2. Transfers with verbal  cueing/supervision  1. Needs 1 person assist  0. Needs 2 person assist  Standing with eyes closed: Numbers; 0-4: 4  4. Stands safely for 10 seconds  3. Stands 10 seconds with supervision   2. Able to stand for 3 seconds  1. Unable to keep eyes closed for 3 seconds, but is safe  0. Needs assist to keep from falling Standing with feet together: Numbers; 0-4: 4 4. Stands for 1 minute safely 3. Stands for 1 minute with supervision 2. Unable to hold for 30 seconds  1. Needs help to attain position but can hold for 15 seconds  0. Needs help to attain position and unable to hold for 15 seconds Reaching forward with outstretched arm: Numbers; 0-4: 3  4. Reaches forward 10 inches  3. Reaches forward 5 inches  2. Reaches forward 2 inches  1. Reaches forward with supervision  0. Loses balance/requires assistace Retrieving object from the floor: Numbers; 0-4: 3 4. Able to pick up easily and safely 3. Able to pick up with supervision 2. Unable to pick up, but reaches within 1-2 inches independently 1. Unable to pick up and needs supervision 0. Unable/needs assistance to keep from falling  Turning to look behind: Numbers; 0-4: 2  4. Looks behind from both sides and weight shifts well  3. Looks behind one side only, other side less weight shift  2. Turns sideways only, maintains balance  1. Needs supervision when turning  0. Needs assistance  Turning 360 degrees: Numbers; 0-4: 2  4. Able to turn in </=4 seconds  3. Able to turn on one side in </= 4  seconds   2. Able to turn slowly, but safely  1. Needs supervision or verbal cueing  0. Needs assistance Place alternate foot on stool: Numbers; 0-4: 4 4. Completes 8 steps in 20 seconds 3. Completes 8 steps in >20 seconds 2. 4 steps without assistance/supervision 1. Completes >2 steps with minimal assist 0. Unable, needs assist to keep from falling Standing with one foot in front: Numbers; 0-4: 0  4. Independent tandem for 30 seconds  3. Independent foot ahead for 30 seconds  2. Independent small step for 30 seconds  1. Needs help to step, but can hold for 15 seconds  0. Loses balance while standing/stepping Standing on one foot: Numbers; 0-4: 2 4. Holds >10 seconds 3. Holds 5-10 seconds 2. Holds >/=3 seconds  1. Holds <3 seconds 0. Unable   Total Score: 40/56                                                         PALPATION:   TTP R UT, Infra and supraspinatus muscle belly   R/C test cluster (+)  SPECIAL TESTS:  Spurling's (-) for UE sxs  PATIENT SURVEYS:  FOTO:   TODAY'S TREATMENT  09/04/22 Manual Therapy: to decrease muscle spasm, pain and improve mobility STM to R UT,LS,  Therapeutic Exercise: to improve strength and mobility.  Demo, verbal and tactile cues throughout for technique.  UT and LS stretch 3x10" Wrist extension and flexion stretch 3x10" Shoulder rolls  IFC Estim to bil UT and cerival paraspinals x 10   08/31/22 Manual Therapy: to decrease muscle spasm, pain and improve mobility STM to R UT,LS, middle deltoid in sitting  Therapeutic Exercise: to improve strength  and mobility.  Demo, verbal and tactile cues throughout for technique.  Seated R UT and LS stretch 2x10" R wrist ext and flex stretch 2x10" Shoulder rolls x 5 - spasm in R triceps afterward  Moist heat to R shoulder and neck x 8  min   PATIENT EDUCATION:  POC, diagnosis, and prognosis.  Pt educated via explanation, demonstration, and handout (HEP).  Pt confirms  understanding verbally.   HOME EXERCISE PROGRAM: Access Code: [email or web address] URL: [email or web address] Date: 08/31/2022 Prepared by: Verta Ellen  Exercises - Seated Upper Trapezius Stretch  - 1 x daily - 7 x weekly - 3 sets - 10 sec hold - Gentle Levator Scapulae Stretch  - 1 x daily - 7 x weekly - 3 sets - 10 sec hold - Seated Wrist Flexion Stretch  - 1 x daily - 7 x weekly - 3 sets - 10 sec hold - Seated Wrist Extension Stretch  - 1 x daily - 7 x weekly - 3 sets - 10 sec hold  ASSESSMENT:  CLINICAL IMPRESSION: Donna Cowan continues to be limited with exercise by pain. Focused session on pain control with manual and gentle stretching. She was less sensitive in the R UT area today than she was the last time. We also did a trial of estim which seemed to help with the pain.  OBJECTIVE IMPAIRMENTS: Pain, shoulder ROM, shoulder strength, balance  ACTIVITY LIMITATIONS: work, housework, lifting, reaching  PERSONAL FACTORS: See medical history and pertinent history   REHAB POTENTIAL: Fair unclear etiology of balance problem  CLINICAL DECISION MAKING: Evolving/moderate complexity  EVALUATION COMPLEXITY: Moderate   GOALS:   SHORT TERM GOALS: Target date: 09/26/2022   Donna Cowan will be >75% HEP compliant to improve carryover between sessions and facilitate independent management of condition  Evaluation: ongoing Goal status: INITIAL   LONG TERM GOALS: Target date: 10/24/2022   Donna Cowan will improve FOTO score to goal as a proxy for functional improvement  Evaluation/Baseline: TAKE VISIT 2 Goal status: INITIAL    2.  Donna Cowan will imporve BERG balance score to 47 pts, to show a significant improvement in balance in order to reduce fall risk  Evaluation/Baseline: 40 pts Goal status: INITIAL  See interpretation below:      3.  Donna Cowan will improve 30'' STS (MCID 2) to >/= 8x (w/ UE?: Y) to show improved LE strength and improved transfers    Evaluation/Baseline: 4x  w/ UE? Y Goal status: INITIAL   4.  Donna Cowan will self report >/= 50% decrease in shoulder pain from evaluation   Evaluation/Baseline: 10/10 max pain Goal status: INITIAL  5.  Donna Cowan will improve the following MMTs to >/= 4+/5 to show improvement in strength:     Evaluation/Baseline:  UPPER EXTREMITY MMT:  MMT Right 08/29/2022 Left 08/29/2022  Shoulder flexion P! n  Shoulder abduction (C5) P! n  Shoulder ER 3+* n  Shoulder IR 3+* n  Middle trapezius    Lower trapezius    Shoulder extension    Grip strength    Shoulder shrug (C4)    Elbow flexion (C6)    Elbow ext (C7)    Thumb ext (C8)    Finger abd (T1)    Grossly     (Blank rows = not tested, score listed is out of 5 possible points.  N = WNL, D = diminished, C = clear for gross weakness with myotome testing, * = concordant pain with testing)  Goal status: INITIAL    PLAN: PT FREQUENCY: 1-2x/week  PT  DURATION: 8 weeks  PLANNED INTERVENTIONS: Therapeutic exercises, Aquatic therapy, Therapeutic activity, Neuro Muscular re-education, Gait training, Patient/Family education, Joint mobilization, Dry Needling, Electrical stimulation, Spinal mobilization and/or manipulation, Moist heat, Taping, Vasopneumatic device, Ionotophoresis 4mg /ml Dexamethasone, and Manual therapy  PLAN FOR NEXT SESSION: FOTO; continue with manual and pain control; maybe R shoulder isometrics?  Darleene Cleaver, PTA  09/04/2022, 11:58 AM

## 2022-09-07 ENCOUNTER — Ambulatory Visit
Admission: RE | Admit: 2022-09-07 | Discharge: 2022-09-07 | Disposition: A | Discharge status: Home / Self Care | Payer: 59 | Source: Ambulatory Visit | Attending: Neurology | Admitting: Neurology

## 2022-09-07 ENCOUNTER — Encounter: Payer: Self-pay | Admitting: Physical Therapy

## 2022-09-07 ENCOUNTER — Ambulatory Visit: Payer: 59 | Admitting: Physical Therapy

## 2022-09-07 DIAGNOSIS — M25511 Pain in right shoulder: Secondary | ICD-10-CM

## 2022-09-07 DIAGNOSIS — R296 Repeated falls: Secondary | ICD-10-CM

## 2022-09-07 DIAGNOSIS — M6281 Muscle weakness (generalized): Secondary | ICD-10-CM

## 2022-09-07 DIAGNOSIS — R2681 Unsteadiness on feet: Secondary | ICD-10-CM

## 2022-09-07 NOTE — Therapy (Signed)
OUTPATIENT PHYSICAL THERAPY TREATMENT  Patient Name: Ritchie Jansky MRN: [id number] DOB:1965/11/19, 57 y.o., female Today's Date: 09/07/2022   PT End of Session - 09/07/22 1103     Visit Number 4    Number of Visits --   1-2x/week   Date for PT Re-Evaluation 10/24/22    Authorization Type [employer]    PT Start Time 1103    PT Stop Time 1145    PT Time Calculation (min) 42 min    Activity Tolerance Patient limited by pain    Behavior During Therapy [facility] for tasks assessed/performed               Past Medical History:  Diagnosis Date   Back pain    Chronic back pain    Chronic neck pain    Diabetes (HCC)    Edema of both lower extremities    Hypothyroidism    Joint pain    Lactose intolerance    Sleep apnea    Thyroid disease    Past Surgical History:  Procedure Laterality Date   THYROID SURGERY     TUBAL LIGATION     Patient Active Problem List   Diagnosis Date Noted   Anger reaction 05/09/2021   Family discord 05/09/2021   PTSD (post-traumatic stress disorder) 05/09/2021   Chronic right-sided headache 07/19/2020   Right sided temporal headache 07/14/2020   Carpal tunnel syndrome 06/20/2020   Leg swelling 06/20/2020   Ankle strain 06/20/2020   Tinnitus of right ear 05/19/2020   Class 1 obesity due to excess calories with body mass index (BMI) of 31.0 to 31.9 in adult 08/12/2019   Bereavement reaction 03/29/2019   Type 2 diabetes mellitus without complications (HCC) 03/29/2019   Graves disease 03/29/2019   Healthcare maintenance 03/29/2019   Vitamin D deficiency 03/29/2019   Back injury, sequela 03/29/2019    PCP: Leilani Able, MD  REFERRING PROVIDER: Glendale Chard, DO  THERAPY DIAG:  Right shoulder pain, unspecified chronicity  Muscle weakness  Unsteadiness on feet  REFERRING DIAG: Balance problem [R26.89], Falls frequently [R29.6], Right arm pain [M79.601]   Rationale for Evaluation and Treatment:  Rehabilitation  SUBJECTIVE:  PERTINENT  PAST HISTORY:  TII DM, graves disease, back pain, carpal tunnel        PRECAUTIONS: Fall  WEIGHT BEARING RESTRICTIONS No  FALLS:  Has patient fallen in last 6 months? Yes, Number of falls: 4x - started with a fall down steps (unsure of cause), fell into truck and hurt R arm, fell into wall, fell out of chair  MOI/History of condition:  Onset date: May 18  SUBJECTIVE STATEMENT Janani Langwell reports that the last 2 days have been terrible.  Has lumbar MRI scheduled for later today.   Can't get comfortable at all with her shoulder.   Unable to take pain medication prior to therapy due to pain meds making her sleepy.    From referring provider: "Falls, unclear etiology. There is no evidence of myelopathy, neuropathy, or myopathy. She retains consciousness making seizures unlikely. Her exam shows mild right arm and leg weakness with normal reflexes and sensation. To further evaluation, I will order MRI brain wo contrast. Going forward, consider MRI lumbar spine."    Red flags:  Unexplained balance deficits  Pain:  Are you having pain? 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 No acute fracture or traumatic listhesis of the cervical spine. 2. Moderate multilevel degenerative disc disease, most pronounced at C5-6.  GENERAL OBSERVATION/GAIT:  Unsteady gait, forward trunk lean  SENSATION:  Light touch: Appears intact with exception of digits 4 and 5 R hand   UPPER EXTREMITY A/P ROM:  ROM Right (Eval)  Left (Eval)  Shoulder flexion 90*/110* 130  Shoulder abduction 80*/100* 120  Shoulder internal rotation    Shoulder external rotation    Functional IR R hip* L3  Functional ER R ear* C7  Shoulder extension    Elbow extension    Elbow flexion     (Blank rows = not tested, N = WNL, * = concordant pain with testing)     LE MMT:  MMT Right (Eval) Left (Eval)  Hip flexion (L2, L3) c c  Knee extension (L3) c c  Knee flexion    Hip abduction    Hip extension    Hip external rotation    Hip internal rotation    Hip adduction    Ankle dorsiflexion (L4) c c  Ankle plantarflexion (S1) c c  Ankle inversion    Ankle eversion    Great Toe ext (L5) c c  Grossly     (Blank rows = not tested, score listed is out of 5 possible points.  N = WNL, D = diminished, C = clear for gross weakness with myotome testing, * = concordant pain with testing)   UPPER EXTREMITY MMT:  MMT Right 08/29/2022 Left 08/29/2022  Shoulder flexion P! n  Shoulder abduction (C5) P! n  Shoulder ER 3+* n  Shoulder IR 3+* n  Middle trapezius    Lower trapezius    Shoulder extension    Grip strength    Shoulder shrug (C4)    Elbow flexion (C6)    Elbow ext (C7)    Thumb ext (C8)    Finger abd (T1)    Grossly     (Blank rows = not tested, score listed is out of 5 possible points.  N = WNL, D = diminished, C = clear for gross weakness with myotome testing, * = concordant pain with testing)   Functional Tests   Berg 40/56   PALPATION:   TTP R UT, Infra and supraspinatus muscle belly   R/C test cluster (+)  SPECIAL TESTS:  Spurling's (-) for UE sxs  PATIENT SURVEYS:  FOTO:   TODAY'S TREATMENT  09/07/22 Therapeutic Exercise: to improve strength and mobility.  Demo, verbal and tactile cues throughout for technique. Isometric shoulder IR, ER - too painful, scap squeezes, very painful, table slides, too painful Therapeutic Activity:  discussing concerns, recommendations for seeing orthopedist,  taking vitals, assessing symptoms, when to go to urgent care v. Er Ultrasound: x 8 min to R upper trap 1 MHz, 1.2 w/cm2 cont to decrease inflammation/pain Modalities: started to apply IFC to R shoulder when patient started to complain of dizziness and faintness, stopped, repositioned patient into reverse trendelenberg and cold compress to neck.    09/04/22 Manual Therapy: to decrease muscle spasm, pain and improve mobility STM to R UT,LS,  Therapeutic Exercise: to improve strength and mobility.  Demo, verbal and tactile cues throughout for  technique.  UT and LS stretch 3x10" Wrist extension and flexion stretch 3x10" Shoulder rolls  IFC Estim to bil UT and cerival paraspinals x 10   08/31/22 Manual Therapy: to decrease muscle spasm, pain and improve mobility STM to R UT,LS, middle deltoid in sitting  Therapeutic Exercise: to improve strength and mobility.  Demo, verbal and tactile cues throughout for technique.  Seated R UT and LS stretch 2x10" R wrist ext and flex stretch 2x10" Shoulder rolls x 5 - spasm in R triceps afterward  Moist heat to R shoulder and neck x 8  min   PATIENT EDUCATION:  POC, diagnosis, and prognosis.  Pt educated via explanation, demonstration, and handout (HEP).  Pt confirms understanding verbally.   HOME EXERCISE PROGRAM: Access Code: [email or web address] URL: [email or web address] Date: 08/31/2022 Prepared by: Verta Ellen  Exercises - Seated Upper Trapezius Stretch  - 1 x daily - 7 x weekly - 3 sets - 10 sec hold - Gentle Levator Scapulae Stretch  - 1 x daily - 7 x weekly - 3 sets - 10 sec hold - Seated Wrist Flexion Stretch  - 1 x daily - 7 x weekly - 3 sets - 10 sec hold - Seated Wrist Extension Stretch  - 1 x daily - 7 x weekly - 3 sets - 10 sec hold  ASSESSMENT:  CLINICAL IMPRESSION: Niha reported increased R shoulder pain today, unable to tolerated any exercises, even AAROM or isometric, large palpable trigger point in R UT, initially  responded well to [place], but when applying IFC to area started complaining of dizziness and nausuea, interventions stopped, patient placed in reverse trendelenberg and cold compress to neck while vitals taken,  Vitals were stable, O2 96, HR 61, BP 110/70.  Patient recovered, sat quietly in waiting area x 5 min then escorted to car with no further incidents or signs of instability.  Discussed following up on referral to orthopedist as recommended by evaluating therapist, will continue working on deficits.   OBJECTIVE IMPAIRMENTS: Pain, shoulder ROM, shoulder strength, balance  ACTIVITY LIMITATIONS: work, housework, lifting, reaching  PERSONAL FACTORS: See medical history and pertinent history   REHAB POTENTIAL: Fair unclear etiology of balance problem  CLINICAL DECISION MAKING: Evolving/moderate complexity  EVALUATION COMPLEXITY: Moderate   GOALS:   SHORT TERM GOALS: Target date: 09/26/2022   Ula will be >75% HEP compliant to improve carryover between sessions and facilitate independent management of condition  Evaluation: ongoing Goal status: INITIAL   LONG TERM GOALS: Target date: 10/24/2022   Arlyle will improve FOTO score to goal as a proxy for functional improvement  Evaluation/Baseline: TAKE VISIT 2 Goal status: INITIAL    2.  Arissa will imporve BERG balance score to 47 pts, to show a significant improvement in balance in order to reduce fall risk  Evaluation/Baseline: 40 pts Goal status: INITIAL  See interpretation below:      3.  Franchon will improve 30'' STS (MCID 2) to >/= 8x (w/ UE?: Y) to show improved LE strength and improved transfers   Evaluation/Baseline: 4x  w/ UE? Y Goal status: INITIAL   4.  Shanda will self report >/= 50% decrease in shoulder pain from evaluation   Evaluation/Baseline: 10/10 max pain Goal status: INITIAL  5.  Riane will improve the following MMTs to >/= 4+/5 to show improvement in strength:      Evaluation/Baseline:  UPPER EXTREMITY MMT:  MMT Right 08/29/2022 Left 08/29/2022  Shoulder flexion P! n  Shoulder abduction (C5) P! n  Shoulder ER  3+* n  Shoulder IR 3+* n  Middle trapezius    Lower trapezius    Shoulder extension    Grip strength    Shoulder shrug (C4)    Elbow flexion (C6)    Elbow ext (C7)    Thumb ext (C8)    Finger abd (T1)    Grossly     (Blank rows = not tested, score listed is out of 5 possible points.  N = WNL, D = diminished, C = clear for gross weakness with myotome testing, * = concordant pain with testing)  Goal status: INITIAL    PLAN: PT FREQUENCY: 1-2x/week  PT DURATION: 8 weeks  PLANNED INTERVENTIONS: Therapeutic exercises, Aquatic therapy, Therapeutic activity, Neuro Muscular re-education, Gait training, Patient/Family education, Joint mobilization, Dry Needling, Electrical stimulation, Spinal mobilization and/or manipulation, Moist heat, Taping, Vasopneumatic device, Ionotophoresis 4mg /ml Dexamethasone, and Manual therapy  PLAN FOR NEXT SESSION: FOTO; continue with manual and pain control; maybe R shoulder isometrics?  Jena Gauss, PT  09/07/2022, 12:17 PM

## 2022-09-10 ENCOUNTER — Ambulatory Visit: Payer: 59 | Admitting: Neurology

## 2022-09-11 ENCOUNTER — Ambulatory Visit: Payer: 59

## 2022-09-11 DIAGNOSIS — M25511 Pain in right shoulder: Secondary | ICD-10-CM

## 2022-09-11 DIAGNOSIS — M5459 Other low back pain: Secondary | ICD-10-CM

## 2022-09-11 DIAGNOSIS — R2681 Unsteadiness on feet: Secondary | ICD-10-CM

## 2022-09-11 DIAGNOSIS — M6281 Muscle weakness (generalized): Secondary | ICD-10-CM

## 2022-09-11 NOTE — Therapy (Signed)
OUTPATIENT PHYSICAL THERAPY TREATMENT  Patient Name: Donna Cowan MRN: [id number] DOB:02/26/1965, 57 y.o., female Today's Date: 09/11/2022   PT End of Session - 09/11/22 1701     Visit Number 5    Number of Visits --   1-2x/week   Date for PT Re-Evaluation 10/24/22    Authorization Type U[employer]    PT Start Time 1620    PT Stop Time 1706    PT Time Calculation (min) 46 min    Activity Tolerance Patient tolerated treatment well    Behavior During Therapy [facility] for tasks assessed/performed                Past Medical History:  Diagnosis Date   Back pain    Chronic back pain    Chronic neck pain    Diabetes (HCC)    Edema of both lower extremities    Hypothyroidism    Joint pain    Lactose intolerance    Sleep apnea    Thyroid disease    Past Surgical History:  Procedure Laterality Date   THYROID SURGERY     TUBAL LIGATION     Patient Active Problem List   Diagnosis Date Noted   Anger reaction 05/09/2021   Family discord 05/09/2021   PTSD (post-traumatic stress disorder) 05/09/2021   Chronic right-sided headache 07/19/2020   Right sided temporal headache 07/14/2020   Carpal tunnel syndrome 06/20/2020   Leg swelling 06/20/2020   Ankle strain 06/20/2020   Tinnitus of right ear 05/19/2020   Class 1 obesity due to excess calories with body mass index (BMI) of 31.0 to 31.9 in adult 08/12/2019   Bereavement reaction 03/29/2019   Type 2 diabetes mellitus without complications (HCC) 03/29/2019   Graves disease 03/29/2019   Healthcare maintenance 03/29/2019   Vitamin D deficiency 03/29/2019   Back injury, sequela 03/29/2019    PCP: Leilani Able, MD  REFERRING PROVIDER: Glendale Chard, DO  THERAPY DIAG:  Right shoulder pain, unspecified chronicity  Muscle weakness  Unsteadiness on feet  Other low back pain  REFERRING DIAG: Balance problem [R26.89], Falls frequently [R29.6], Right arm pain [M79.601]   Rationale for Evaluation and Treatment:   Rehabilitation  SUBJECTIVE:  PERTINENT PAST HISTORY:  TII DM, graves disease, back pain, carpal tunnel        PRECAUTIONS: Fall  WEIGHT BEARING RESTRICTIONS No  FALLS:  Has patient fallen in last 6 months? Yes, Number of falls: 4x - started with a fall down steps (unsure of cause), fell into truck and hurt R arm, fell into wall, fell out of chair  MOI/History of condition:  Onset date: May 18  SUBJECTIVE STATEMENT [name] reports that her R pinky finger is cold now. She had her MRI on Friday, still waiting on results.   From referring provider: "Falls, unclear etiology. There is no evidence of myelopathy, neuropathy, or myopathy. She retains consciousness making seizures unlikely. Her exam shows mild right arm and leg weakness with normal reflexes and sensation. To further evaluation, I will order MRI brain wo contrast. Going forward, consider MRI lumbar spine."    Red flags:  Unexplained balance deficits  Pain:  Are you having pain? 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 No acute fracture or traumatic listhesis of the cervical spine. 2. Moderate multilevel degenerative disc disease, most pronounced at C5-6.  GENERAL OBSERVATION/GAIT:  Unsteady gait, forward trunk lean  SENSATION:  Light touch: Appears intact with exception of digits 4 and 5 R hand   UPPER EXTREMITY A/P ROM:  ROM Right (Eval) Left (Eval)  Shoulder flexion 90*/110* 130  Shoulder abduction 80*/100* 120  Shoulder  internal rotation    Shoulder external rotation    Functional IR R hip* L3  Functional ER R ear* C7  Shoulder extension    Elbow extension    Elbow flexion     (Blank rows = not tested, N = WNL, * = concordant pain with testing)     LE MMT:  MMT Right (Eval) Left (Eval)  Hip flexion (L2, L3) c c  Knee extension (L3) c c  Knee flexion    Hip abduction    Hip extension    Hip external rotation    Hip internal rotation    Hip adduction    Ankle dorsiflexion (L4) c c  Ankle plantarflexion (S1) c c  Ankle inversion    Ankle eversion    Great Toe ext (L5) c c  Grossly     (Blank rows = not tested, score listed is out of 5 possible points.  N = WNL, D = diminished, C = clear for gross weakness with myotome testing, * = concordant pain with testing)   UPPER EXTREMITY MMT:  MMT Right 08/29/2022 Left 08/29/2022  Shoulder flexion P! n  Shoulder abduction (C5) P! n  Shoulder ER 3+* n  Shoulder IR 3+* n  Middle trapezius    Lower trapezius    Shoulder extension    Grip strength    Shoulder shrug (C4)    Elbow flexion (C6)    Elbow ext (C7)    Thumb ext (C8)    Finger abd (T1)    Grossly     (Blank rows = not tested, score listed is out of 5 possible points.  N = WNL, D = diminished, C = clear for gross weakness with myotome testing, * = concordant pain with testing)   Functional Tests   Berg 40/56   PALPATION:   TTP R UT, Infra and supraspinatus muscle belly   R/C test cluster (+)  SPECIAL TESTS:  Spurling's (-) for UE sxs  PATIENT SURVEYS:  FOTO:   TODAY'S TREATMENT  09/11/22 Ultrasound: x 8 min to R upper trap 1 MHz, 1.2 w/cm2 cont to decrease inflammation/pain  Manual Therapy: to decrease muscle spasm, pain and improve mobility STM to R UT, LS   Therapeutic Exercise: to improve strength and mobility.  Demo, verbal and tactile cues throughout for technique. Shoulder shrugs x 10  Seated R table slides flexion then across body x 10 each  IFC  Estim to bil UT and cervical paraspinals x 10 min with moist heat  09/07/22 Therapeutic Exercise: to improve strength and mobility.  Demo, verbal and tactile cues throughout for technique. Isometric shoulder IR, ER - too painful, scap squeezes, very painful, table slides, too painful Therapeutic Activity:  discussing concerns, recommendations for seeing orthopedist, taking vitals, assessing symptoms, when to go to urgent care v. Er Ultrasound: x 8 min to R upper trap 1 MHz, 1.2 w/cm2 cont to decrease  inflammation/pain Modalities: started to apply IFC to R shoulder when patient started to complain of dizziness and faintness, stopped, repositioned patient into reverse trendelenberg and cold compress to neck.    09/04/22 Manual Therapy: to decrease muscle spasm, pain and improve mobility STM to R UT,LS,  Therapeutic Exercise: to improve strength and mobility.  Demo, verbal and tactile cues throughout for technique.  UT and LS stretch 3x10" Wrist extension and flexion stretch 3x10" Shoulder rolls  IFC Estim to bil UT and cerival paraspinals x 10   08/31/22 Manual Therapy: to decrease muscle spasm, pain and improve mobility STM to R UT,LS, middle deltoid in sitting  Therapeutic Exercise: to improve strength and mobility.  Demo, verbal and tactile cues throughout for technique.  Seated R UT and LS stretch 2x10" R wrist ext and flex stretch 2x10" Shoulder rolls x 5 - spasm in R triceps afterward  Moist heat to R shoulder and neck x 8  min   PATIENT EDUCATION:  POC, diagnosis, and prognosis.  Pt educated via explanation, demonstration, and handout (HEP).  Pt confirms understanding verbally.   HOME EXERCISE PROGRAM: Access Code: [email or web address] URL: [email or web address] Date: 08/31/2022 Prepared by: Verta Ellen  Exercises - Seated Upper Trapezius Stretch  - 1 x daily - 7 x weekly - 3 sets - 10 sec hold - Gentle Levator Scapulae Stretch  - 1 x daily - 7 x weekly - 3 sets - 10  sec hold - Seated Wrist Flexion Stretch  - 1 x daily - 7 x weekly - 3 sets - 10 sec hold - Seated Wrist Extension Stretch  - 1 x daily - 7 x weekly - 3 sets - 10 sec hold  ASSESSMENT:  CLINICAL IMPRESSION: Pt continues to be mostly limited by pain with exercise. She showed a good response to [place] and palpated less muscle tension in R UT. She was able to participate in table slides today, she notes pain with attempting scapular retraction but was able to do shrugs with no pain. Concluded session with estim and moist heat to address pain and muscle tension.    OBJECTIVE IMPAIRMENTS: Pain, shoulder ROM, shoulder strength, balance  ACTIVITY LIMITATIONS: work, housework, lifting, reaching  PERSONAL FACTORS: See medical history and pertinent history   REHAB POTENTIAL: Fair unclear etiology of balance problem  CLINICAL DECISION MAKING: Evolving/moderate complexity  EVALUATION COMPLEXITY: Moderate   GOALS:   SHORT TERM GOALS: Target date: 09/26/2022   Kinze will be >75% HEP compliant to improve carryover between sessions and facilitate independent management of condition  Evaluation: ongoing Goal status: INITIAL   LONG TERM GOALS: Target date: 10/24/2022   Jilian will improve FOTO score to goal as a proxy for functional improvement  Evaluation/Baseline: TAKE VISIT 2 Goal status: INITIAL    2.  Takerria will imporve BERG balance score to 47 pts, to show a significant improvement in balance in order to reduce fall risk  Evaluation/Baseline: 40 pts Goal status: INITIAL  See interpretation below:      3.  Kareli will improve 30'' STS (MCID 2) to >/= 8x (w/ UE?: Y) to show improved LE strength and improved transfers   Evaluation/Baseline: 4x  w/ UE? Y Goal status: INITIAL   4.  Marbella will self report >/= 50% decrease in shoulder pain from evaluation   Evaluation/Baseline: 10/10 max pain Goal status: INITIAL  5.  Tamiko will improve the following  MMTs to >/= 4+/5 to show improvement in strength:     Evaluation/Baseline:  UPPER EXTREMITY  MMT:  MMT Right 08/29/2022 Left 08/29/2022  Shoulder flexion P! n  Shoulder abduction (C5) P! n  Shoulder ER 3+* n  Shoulder IR 3+* n  Middle trapezius    Lower trapezius    Shoulder extension    Grip strength    Shoulder shrug (C4)    Elbow flexion (C6)    Elbow ext (C7)    Thumb ext (C8)    Finger abd (T1)    Grossly     (Blank rows = not tested, score listed is out of 5 possible points.  N = WNL, D = diminished, C = clear for gross weakness with myotome testing, * = concordant pain with testing)  Goal status: INITIAL    PLAN: PT FREQUENCY: 1-2x/week  PT DURATION: 8 weeks  PLANNED INTERVENTIONS: Therapeutic exercises, Aquatic therapy, Therapeutic activity, Neuro Muscular re-education, Gait training, Patient/Family education, Joint mobilization, Dry Needling, Electrical stimulation, Spinal mobilization and/or manipulation, Moist heat, Taping, Vasopneumatic device, Ionotophoresis 4mg /ml Dexamethasone, and Manual therapy  PLAN FOR NEXT SESSION:  continue with manual and pain control; gentle movements of scapula and shoulder  Darleene Cleaver, PTA  09/11/2022, 5:48 PM

## 2022-09-13 ENCOUNTER — Ambulatory Visit: Payer: 59 | Attending: Neurology

## 2022-09-13 DIAGNOSIS — M6281 Muscle weakness (generalized): Secondary | ICD-10-CM | POA: Diagnosis present

## 2022-09-13 DIAGNOSIS — M5459 Other low back pain: Secondary | ICD-10-CM | POA: Diagnosis present

## 2022-09-13 DIAGNOSIS — R2681 Unsteadiness on feet: Secondary | ICD-10-CM | POA: Insufficient documentation

## 2022-09-13 DIAGNOSIS — M25511 Pain in right shoulder: Secondary | ICD-10-CM | POA: Diagnosis not present

## 2022-09-13 NOTE — Therapy (Signed)
OUTPATIENT PHYSICAL THERAPY TREATMENT  Patient Name: Donna Cowan MRN: [id number] DOB:06/30/1965, 57 y.o., female Today's Date: 09/13/2022   PT End of Session - 09/13/22 1625     Visit Number 6    Number of Visits --   1-2x/week   Date for PT Re-Evaluation 10/24/22    Authorization Type UHC    PT Start Time 1530    PT Stop Time 1625    PT Time Calculation (min) 55 min    Activity Tolerance Patient tolerated treatment well    Behavior During Therapy [facility] for tasks assessed/performed                 Past Medical History:  Diagnosis Date   Back pain    Chronic back pain    Chronic neck pain    Diabetes (HCC)    Edema of both lower extremities    Hypothyroidism    Joint pain    Lactose intolerance    Sleep apnea    Thyroid disease    Past Surgical History:  Procedure Laterality Date   THYROID SURGERY     TUBAL LIGATION     Patient Active Problem List   Diagnosis Date Noted   Anger reaction 05/09/2021   Family discord 05/09/2021   PTSD (post-traumatic stress disorder) 05/09/2021   Chronic right-sided headache 07/19/2020   Right sided temporal headache 07/14/2020   Carpal tunnel syndrome 06/20/2020   Leg swelling 06/20/2020   Ankle strain 06/20/2020   Tinnitus of right ear 05/19/2020   Class 1 obesity due to excess calories with body mass index (BMI) of 31.0 to 31.9 in adult 08/12/2019   Bereavement reaction 03/29/2019   Type 2 diabetes mellitus without complications (HCC) 03/29/2019   Graves disease 03/29/2019   Healthcare maintenance 03/29/2019   Vitamin D deficiency 03/29/2019   Back injury, sequela 03/29/2019    PCP: Leilani Able, MD  REFERRING PROVIDER: Glendale Chard, DO  THERAPY DIAG:  Right shoulder pain, unspecified chronicity  Muscle weakness  Unsteadiness on feet  Other low back pain  REFERRING DIAG: Balance problem [R26.89], Falls frequently [R29.6], Right arm pain [M79.601]   Rationale for Evaluation and Treatment:   Rehabilitation  SUBJECTIVE:  PERTINENT PAST HISTORY:  TII DM, graves disease, back pain, carpal tunnel        PRECAUTIONS: Fall  WEIGHT BEARING RESTRICTIONS No  FALLS:  Has patient fallen in last 6 months? 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 She is now having some pain in R mid back along scapula.    From referring provider: "Falls, unclear etiology. There is no evidence of myelopathy, neuropathy, or myopathy. She retains consciousness making seizures unlikely. Her exam shows mild right arm and leg weakness with normal reflexes and sensation. To further evaluation, I will order MRI brain wo contrast. Going forward, consider MRI lumbar spine."    Red flags:  Unexplained balance deficits  Pain:  Are you having pain? 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 No acute fracture or traumatic listhesis of the cervical spine. 2. Moderate multilevel degenerative disc disease, most pronounced at C5-6.  GENERAL OBSERVATION/GAIT:  Unsteady gait, forward trunk lean  SENSATION:  Light touch: Appears intact with exception of digits 4 and 5 R hand  Evaluation/Baseline:  UPPER EXTREMITY MMT:  MMT Right 08/29/2022 Left 08/29/2022   Shoulder flexion P! n  Shoulder abduction (C5) P! n  Shoulder ER 3+* n  Shoulder IR 3+* n  Middle trapezius    Lower trapezius    Shoulder extension    Grip strength    Shoulder shrug (C4)    Elbow flexion (C6)    Elbow ext (C7)    Thumb ext (C8)    Finger abd (T1)    Grossly     (Blank rows = not tested, score listed is out of 5 possible points.  N = WNL, D = diminished, C = clear for gross weakness with myotome testing, * = concordant pain with testing)  UPPER EXTREMITY A/P ROM:  ROM Right (Eval) Left (Eval)  Shoulder flexion 90*/110* 130  Shoulder abduction 80*/100* 120  Shoulder internal rotation    Shoulder external rotation    Functional IR R hip* L3  Functional ER R ear* C7  Shoulder extension    Elbow extension    Elbow flexion     (Blank rows = not tested, N = WNL, * = concordant pain with testing)     LE MMT:  MMT Right (Eval) Left (Eval)  Hip flexion (L2, L3) c c  Knee extension (L3) c c  Knee flexion    Hip abduction    Hip extension    Hip external rotation    Hip internal rotation    Hip adduction    Ankle dorsiflexion (L4) c c  Ankle plantarflexion (S1) c c  Ankle inversion    Ankle eversion    Great Toe ext (L5) c c  Grossly     (Blank rows = not tested, score listed is out of 5 possible points.  N = WNL, D = diminished, C = clear for gross weakness with myotome testing, * = concordant pain with testing)   UPPER EXTREMITY MMT:  MMT Right 08/29/2022 Left 08/29/2022  Shoulder flexion P! n  Shoulder abduction (C5) P! n  Shoulder ER 3+* n  Shoulder IR 3+* n  Middle trapezius    Lower trapezius    Shoulder extension    Grip strength    Shoulder shrug (C4)    Elbow flexion (C6)    Elbow ext (C7)    Thumb ext (C8)    Finger abd (T1)    Grossly     (Blank rows = not tested, score listed is out of 5 possible points.  N = WNL, D = diminished, C = clear for gross weakness with myotome testing, * = concordant pain with  testing)   Functional Tests   Berg 40/56   PALPATION:   TTP R UT, Infra and supraspinatus muscle belly   R/C test cluster (+)  SPECIAL TESTS:  Spurling's (-) for UE sxs  PATIENT SURVEYS:  FOTO:   TODAY'S TREATMENT  09/13/22 Manual Therapy: to decrease muscle spasm, pain and improve mobility STM to  R UT, LS rhomboids R scapular mobs and gentle GH joint PROM   Therapeutic Exercise: to improve strength and mobility.  Demo, verbal and tactile cues throughout for technique. Supine AAROM chest press x 10  Supine AAROM flexion x 10 to shoulder height  Moist heat x 10 min to B UT area  09/11/22 Ultrasound: x 8 min to R upper trap 1 MHz, 1.2 w/cm2 cont to decrease inflammation/pain  Manual Therapy: to decrease muscle spasm, pain and improve mobility STM to R UT, LS   Therapeutic Exercise: to improve strength and mobility.  Demo, verbal and tactile cues throughout for technique. Shoulder shrugs x 10  Seated R table slides flexion then across body x 10 each  IFC Estim to bil UT and cervical paraspinals x 10 min with moist heat  09/07/22 Therapeutic Exercise: to improve strength and mobility.  Demo, verbal and tactile cues throughout for technique. Isometric shoulder IR, ER - too painful, scap squeezes, very painful, table slides, too painful Therapeutic Activity:  discussing concerns, recommendations for seeing orthopedist, taking vitals, assessing symptoms, when to go to urgent care v. Er Ultrasound: x 8 min to R upper trap 1 MHz, 1.2 w/cm2 cont to decrease inflammation/pain Modalities: started to apply IFC to R shoulder when patient started to complain of dizziness and faintness, stopped, repositioned patient into reverse trendelenberg and cold compress to neck.    09/04/22 Manual Therapy: to decrease muscle spasm, pain and improve mobility STM to R UT,LS,  Therapeutic Exercise: to improve strength and mobility.  Demo, verbal and tactile cues throughout for technique.  UT  and LS stretch 3x10" Wrist extension and flexion stretch 3x10" Shoulder rolls  IFC Estim to bil UT and cerival paraspinals x 10   08/31/22 Manual Therapy: to decrease muscle spasm, pain and improve mobility STM to R UT,LS, middle deltoid in sitting  Therapeutic Exercise: to improve strength and mobility.  Demo, verbal and tactile cues throughout for technique.  Seated R UT and LS stretch 2x10" R wrist ext and flex stretch 2x10" Shoulder rolls x 5 - spasm in R triceps afterward  Moist heat to R shoulder and neck x 8  min   PATIENT EDUCATION:  POC, diagnosis, and prognosis.  Pt educated via explanation, demonstration, and handout (HEP).  Pt confirms understanding verbally.   HOME EXERCISE PROGRAM: Access Code: [email or web address] URL: [email or web address] Date: 08/31/2022 Prepared by: Verta Ellen  Exercises - Seated Upper Trapezius Stretch  - 1 x daily - 7 x weekly - 3 sets - 10 sec hold - Gentle Levator Scapulae Stretch  - 1 x daily - 7 x weekly - 3 sets - 10 sec hold - Seated Wrist Flexion Stretch  - 1 x daily - 7 x weekly - 3 sets - 10 sec hold - Seated Wrist Extension Stretch  - 1 x daily - 7 x weekly - 3 sets - 10 sec hold  ASSESSMENT:  CLINICAL IMPRESSION: Pt showing slow progress with R shoulder and scapular area, flared up in R wrist today from typing on computer. Able to progress with AAROM exercises in supine. Edu on continuing wrist stretches and avoid strenuous activities if able. Ten minutes moist heat for muscle flexibility.   OBJECTIVE IMPAIRMENTS: Pain, shoulder ROM, shoulder strength, balance  ACTIVITY LIMITATIONS: work, housework, lifting, reaching  PERSONAL FACTORS: See medical history and pertinent history   REHAB POTENTIAL: Fair unclear etiology of balance problem  CLINICAL DECISION MAKING: Evolving/moderate complexity  EVALUATION COMPLEXITY: Moderate   GOALS:   SHORT  TERM GOALS: Target date: 09/26/2022   Donna Cowan will be >75% HEP  compliant to improve carryover between sessions and facilitate independent management of condition  Evaluation: ongoing Goal status: IN PROGRESS   LONG TERM GOALS: Target date: 10/24/2022   Donna Cowan will improve FOTO score to goal as a proxy for functional improvement  Evaluation/Baseline: TAKE VISIT 2 Goal status: INITIAL    2.  Donna Cowan will imporve BERG balance score to 47 pts, to show a significant improvement in balance in order to reduce fall risk  Evaluation/Baseline: 40 pts Goal status: INITIAL      3.  Donna Cowan will improve 30'' STS (MCID 2) to >/= 8x (w/ UE?: Y) to show improved LE strength and improved transfers   Evaluation/Baseline: 4x  w/ UE? Y Goal status: INITIAL   4.  Donna Cowan will self report >/= 50% decrease in shoulder pain from evaluation   Evaluation/Baseline: 10/10 max pain Goal status: INITIAL  5.  Donna Cowan will improve the following MMTs to >/= 4+/5 to show improvement in strength:      Goal status: INITIAL    PLAN: PT FREQUENCY: 1-2x/week  PT DURATION: 8 weeks  PLANNED INTERVENTIONS: Therapeutic exercises, Aquatic therapy, Therapeutic activity, Neuro Muscular re-education, Gait training, Patient/Family education, Joint mobilization, Dry Needling, Electrical stimulation, Spinal mobilization and/or manipulation, Moist heat, Taping, Vasopneumatic device, Ionotophoresis 4mg /ml Dexamethasone, and Manual therapy  PLAN FOR NEXT SESSION:  continue with manual and pain control; gentle movements of scapula and shoulder  Darleene Cleaver, PTA  09/13/2022, 6:05 PM

## 2022-09-16 NOTE — Therapy (Signed)
OUTPATIENT PHYSICAL THERAPY TREATMENT  Patient Name: Donna Cowan MRN: [id number] DOB:06/22/1965, 57 y.o., female Today's Date: 09/17/2022   PT End of Session - 09/17/22 1353     Visit Number 7    Date for PT Re-Evaluation 10/24/22    Authorization Type [employer]    PT Start Time 1353    PT Stop Time 1447    PT Time Calculation (min) 54 min    Activity Tolerance Patient tolerated treatment well    Behavior During Therapy WFL for tasks assessed/performed                  Past Medical History:  Diagnosis Date   Back pain    Chronic back pain    Chronic neck pain    Diabetes (HCC)    Edema of both lower extremities    Hypothyroidism    Joint pain    Lactose intolerance    Sleep apnea    Thyroid disease    Past Surgical History:  Procedure Laterality Date   THYROID SURGERY     TUBAL LIGATION     Patient Active Problem List   Diagnosis Date Noted   Anger reaction 05/09/2021   Family discord 05/09/2021   PTSD (post-traumatic stress disorder) 05/09/2021   Chronic right-sided headache 07/19/2020   Right sided temporal headache 07/14/2020   Carpal tunnel syndrome 06/20/2020   Leg swelling 06/20/2020   Ankle strain 06/20/2020   Tinnitus of right ear 05/19/2020   Class 1 obesity due to excess calories with body mass index (BMI) of 31.0 to 31.9 in adult 08/12/2019   Bereavement reaction 03/29/2019   Type 2 diabetes mellitus without complications (HCC) 03/29/2019   Graves disease 03/29/2019   Healthcare maintenance 03/29/2019   Vitamin D deficiency 03/29/2019   Back injury, sequela 03/29/2019    PCP: Leilani Able, MD  REFERRING PROVIDER: Glendale Chard, DO  THERAPY DIAG:  Right shoulder pain, unspecified chronicity  Muscle weakness  Unsteadiness on feet  Other low back pain  REFERRING DIAG: Balance problem [R26.89], Falls frequently [R29.6], Right arm pain [M79.601]   Rationale for Evaluation and Treatment:  Rehabilitation  SUBJECTIVE:  PERTINENT  PAST HISTORY:  TII DM, graves disease, back pain, carpal tunnel        PRECAUTIONS: Fall  WEIGHT BEARING RESTRICTIONS No  FALLS:  Has patient fallen in last 6 months? Yes, Number of falls: 4x - started with a fall down steps (unsure of cause), fell into truck and hurt R arm, fell into wall, fell out of chair  MOI/History of condition:  Onset date: May 18  SUBJECTIVE STATEMENT Yesterday was a bad day   From referring provider: "Falls, unclear etiology. There is no evidence of myelopathy, neuropathy, or myopathy. She retains consciousness making seizures unlikely. Her exam shows mild right arm and leg weakness with normal reflexes and sensation. To further evaluation, I will order MRI brain wo contrast. Going forward, consider MRI lumbar spine."    Red flags:  Unexplained balance deficits  Pain:  Are you having pain? 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 No acute fracture or traumatic listhesis of the cervical spine. 2. Moderate multilevel degenerative disc disease, most pronounced at C5-6.  GENERAL OBSERVATION/GAIT:  Unsteady gait, forward trunk lean  SENSATION:  Light touch: Appears intact with exception of digits 4 and 5 R hand  Evaluation/Baseline:  UPPER EXTREMITY MMT:  MMT Right 08/29/2022 Left 08/29/2022  Shoulder flexion P! n  Shoulder abduction (C5) P! n  Shoulder ER 3+* n  Shoulder IR 3+* n  Middle trapezius    Lower trapezius    Shoulder extension    Grip strength    Shoulder  shrug (C4)    Elbow flexion (C6)    Elbow ext (C7)    Thumb ext (C8)    Finger abd (T1)    Grossly     (Blank rows = not tested, score listed is out of 5 possible points.  N = WNL, D = diminished, C = clear for gross weakness with myotome testing, * = concordant pain with testing)  UPPER EXTREMITY A/P ROM:  ROM Right (Eval) Left (Eval)  Shoulder flexion 90*/110* 130  Shoulder abduction 80*/100* 120  Shoulder internal rotation    Shoulder external rotation    Functional IR R hip* L3  Functional ER R ear* C7  Shoulder extension    Elbow extension    Elbow flexion     (Blank rows = not tested, N = WNL, * = concordant pain with testing)     LE MMT:  MMT Right (Eval) Left (Eval)  Hip flexion (L2, L3) c c  Knee extension (L3) c c  Knee flexion    Hip abduction    Hip extension    Hip external rotation    Hip internal rotation    Hip adduction    Ankle dorsiflexion (L4) c c  Ankle plantarflexion (S1) c c  Ankle inversion    Ankle eversion    Great Toe ext (L5) c c  Grossly     (Blank rows = not tested, score listed is out of 5 possible points.  N = WNL, D = diminished, C = clear for gross weakness with myotome testing, * = concordant pain with testing)   UPPER EXTREMITY MMT:  MMT Right 08/29/2022 Left 08/29/2022  Shoulder flexion P! n  Shoulder abduction (C5) P! n  Shoulder ER 3+* n  Shoulder IR 3+* n  Middle trapezius    Lower trapezius    Shoulder extension    Grip strength    Shoulder shrug (C4)    Elbow flexion (C6)    Elbow ext (C7)    Thumb ext (C8)    Finger abd (T1)    Grossly     (Blank rows = not tested, score listed is out of 5 possible points.  N = WNL, D = diminished, C = clear for gross weakness with myotome testing, * = concordant pain with testing)   Functional Tests   Berg 40/56   PALPATION:   TTP R UT, Infra and supraspinatus muscle belly   R/C test cluster (+)  SPECIAL TESTS:  Spurling's (-) for UE sxs  PATIENT SURVEYS:   FOTO:   TODAY'S TREATMENT  09/17/22 Manual Therapy: to decrease muscle spasm, pain and improve mobility STM and IASTM with EDGE tool to R UT, LS rhomboids, cervical paraspinals Gentle PA mobs throacic spine - not tolerated Supine cervical traction - gentle 3 x 15 sec hold   Therapeutic Exercise: to improve strength and mobility.  Demo, verbal and tactile cues throughout for technique. Attempted wall slide with wash cloth - partial range only x 3 Seated R shoulder isometrics - flex, IR, ER, ABD and ext 10 sec hold x 5 Supine scapular retraction with B shoulder ER x 5 (pt experienced back spasms after these)  Self care: discussed pros and cons of DN. Handout provided. Pt to research and possibly be willing to try.   Moist heat x 10 min to B UT and thoracic spine at end of session   09/13/22 Manual Therapy: to decrease muscle spasm, pain and improve mobility STM to R UT, LS rhomboids R scapular mobs and gentle GH joint PROM   Therapeutic Exercise: to improve strength and mobility.  Demo, verbal and tactile cues throughout for technique. Supine AAROM chest press x 10  Supine AAROM flexion x 10 to shoulder height  Moist heat x 10 min to B UT area  09/11/22 Ultrasound: x 8 min to R upper trap 1 MHz, 1.2 w/cm2 cont to decrease inflammation/pain  Manual Therapy: to decrease muscle spasm, pain and improve mobility STM to R UT, LS   Therapeutic Exercise: to improve strength and mobility.  Demo, verbal and tactile cues throughout for technique. Shoulder shrugs x 10  Seated R table slides flexion then across body x 10 each  IFC Estim to bil UT and cervical paraspinals x 10 min with moist heat  09/07/22 Therapeutic Exercise: to improve strength and mobility.  Demo, verbal and tactile cues throughout for technique. Isometric shoulder IR, ER - too painful, scap squeezes, very painful, table slides, too painful Therapeutic Activity:  discussing concerns, recommendations for seeing  orthopedist, taking vitals, assessing symptoms, when to go to urgent care v. Er Ultrasound: x 8 min to R upper trap 1 MHz, 1.2 w/cm2 cont to decrease inflammation/pain Modalities: started to apply IFC to R shoulder when patient started to complain of dizziness and faintness, stopped, repositioned patient into reverse trendelenberg and cold compress to neck.    09/04/22 Manual Therapy: to decrease muscle spasm, pain and improve mobility STM to R UT,LS,  Therapeutic Exercise: to improve strength and mobility.  Demo, verbal and tactile cues throughout for technique.  UT and LS stretch 3x10" Wrist extension and flexion stretch 3x10" Shoulder rolls  IFC Estim to bil UT and cerival paraspinals x 10   08/31/22 Manual Therapy: to decrease muscle spasm, pain and improve mobility STM to R UT,LS, middle deltoid in sitting  Therapeutic Exercise: to improve strength and mobility.  Demo, verbal and tactile cues throughout for technique.  Seated R UT and LS stretch 2x10" R wrist ext and flex stretch 2x10" Shoulder rolls x 5 - spasm in R triceps afterward  Moist heat to R shoulder and neck x 8  min   PATIENT EDUCATION:  POC, diagnosis, and prognosis.  Pt educated via explanation, demonstration, and handout (HEP).  Pt confirms understanding verbally.   HOME EXERCISE PROGRAM: Access Code: [email or web address] URL: [email or web address] Date: 08/31/2022 Prepared by: Verta Ellen  Exercises - Seated Upper Trapezius Stretch  - 1 x daily - 7 x weekly - 3 sets - 10 sec hold - Gentle Levator Scapulae Stretch  - 1 x daily - 7 x weekly - 3 sets - 10 sec hold - Seated Wrist Flexion Stretch  - 1 x daily - 7 x weekly - 3 sets - 10 sec hold - Seated Wrist Extension Stretch  - 1 x daily - 7 x weekly - 3 sets - 10 sec hold  ASSESSMENT:  CLINICAL IMPRESSION: Donna Cowan continues to have marked pain in the R upper quadrant and neck. She tolerated shoulder isometrics well and these were issued for HEP.  She experienced upper back spasms after doing supine scapular retraction. Unclear whether supine traction had any benefit today. We discussed trial of DN to see if she could tolerate this modality. She will research at home and may be open to one or two areas next visit.   OBJECTIVE IMPAIRMENTS: Pain, shoulder ROM, shoulder strength, balance  ACTIVITY LIMITATIONS: work, housework, lifting, reaching  PERSONAL FACTORS: See medical history and pertinent history   REHAB POTENTIAL: Fair unclear etiology of balance problem  CLINICAL DECISION MAKING: Evolving/moderate complexity  EVALUATION COMPLEXITY: Moderate   GOALS:   SHORT TERM GOALS: Target date: 09/26/2022   Donna Cowan will be >75% HEP compliant to improve carryover between sessions and facilitate independent management of condition  Evaluation: ongoing Goal status: IN PROGRESS   LONG TERM GOALS: Target date: 10/24/2022   Donna Cowan will improve FOTO score to goal as a proxy for functional improvement  Evaluation/Baseline: TAKE VISIT 2 Goal status: INITIAL    2.  Donna Cowan will imporve BERG balance score to 47 pts, to show a significant improvement in balance in order to reduce fall risk  Evaluation/Baseline: 40 pts Goal status: INITIAL   3.  Donna Cowan will improve 30'' STS (MCID 2) to >/= 8x (w/ UE?: Y) to show improved LE strength and improved transfers   Evaluation/Baseline: 4x  w/ UE? Y Goal status: INITIAL   4.  Donna Cowan will self report >/= 50% decrease in shoulder pain from evaluation   Evaluation/Baseline: 10/10 max pain Goal status: INITIAL  5.  Donna Cowan will improve the following MMTs to >/= 4+/5 to show improvement in strength:      Goal status: INITIAL    PLAN: PT FREQUENCY: 1-2x/week  PT DURATION: 8 weeks  PLANNED INTERVENTIONS: Therapeutic exercises, Aquatic therapy, Therapeutic activity, Neuro Muscular re-education, Gait training, Patient/Family education, Joint mobilization, Dry  Needling, Electrical stimulation, Spinal mobilization and/or manipulation, Moist heat, Taping, Vasopneumatic device, Ionotophoresis 4mg /ml Dexamethasone, and Manual therapy  PLAN FOR NEXT SESSION:  DN if pt agreeable, continue with manual and pain control; gentle movements of scapula and shoulder  Solon Palm, PT  09/17/2022, 2:56 PM

## 2022-09-17 ENCOUNTER — Encounter: Payer: Self-pay | Admitting: Physical Therapy

## 2022-09-17 ENCOUNTER — Ambulatory Visit: Payer: 59 | Admitting: Physical Therapy

## 2022-09-17 DIAGNOSIS — R2681 Unsteadiness on feet: Secondary | ICD-10-CM

## 2022-09-17 DIAGNOSIS — M25511 Pain in right shoulder: Secondary | ICD-10-CM | POA: Diagnosis not present

## 2022-09-17 DIAGNOSIS — M5459 Other low back pain: Secondary | ICD-10-CM

## 2022-09-17 DIAGNOSIS — M6281 Muscle weakness (generalized): Secondary | ICD-10-CM

## 2022-09-20 ENCOUNTER — Other Ambulatory Visit: Payer: Self-pay

## 2022-09-20 ENCOUNTER — Ambulatory Visit: Payer: 59

## 2022-09-20 DIAGNOSIS — M6281 Muscle weakness (generalized): Secondary | ICD-10-CM

## 2022-09-20 DIAGNOSIS — M25511 Pain in right shoulder: Secondary | ICD-10-CM

## 2022-09-20 DIAGNOSIS — R2681 Unsteadiness on feet: Secondary | ICD-10-CM

## 2022-09-20 NOTE — Therapy (Signed)
OUTPATIENT PHYSICAL THERAPY TREATMENT  Patient Name: Donna Cowan MRN: [id number] DOB:December 24, 1965, 57 y.o., female Today's Date: 09/20/2022   PT End of Session - 09/20/22 1602     Visit Number 8    Date for PT Re-Evaluation 10/24/22    Authorization Type [employer]    PT Start Time 0845    PT Stop Time 0940    PT Time Calculation (min) 55 min    Activity Tolerance Patient tolerated treatment well    Behavior During Therapy [facility] for tasks assessed/performed                   Past Medical History:  Diagnosis Date   Back pain    Chronic back pain    Chronic neck pain    Diabetes (HCC)    Edema of both lower extremities    Hypothyroidism    Joint pain    Lactose intolerance    Sleep apnea    Thyroid disease    Past Surgical History:  Procedure Laterality Date   THYROID SURGERY     TUBAL LIGATION     Patient Active Problem List   Diagnosis Date Noted   Anger reaction 05/09/2021   Family discord 05/09/2021   PTSD (post-traumatic stress disorder) 05/09/2021   Chronic right-sided headache 07/19/2020   Right sided temporal headache 07/14/2020   Carpal tunnel syndrome 06/20/2020   Leg swelling 06/20/2020   Ankle strain 06/20/2020   Tinnitus of right ear 05/19/2020   Class 1 obesity due to excess calories with body mass index (BMI) of 31.0 to 31.9 in adult 08/12/2019   Bereavement reaction 03/29/2019   Type 2 diabetes mellitus without complications (HCC) 03/29/2019   Graves disease 03/29/2019   Healthcare maintenance 03/29/2019   Vitamin D deficiency 03/29/2019   Back injury, sequela 03/29/2019    PCP: Leilani Able, MD  REFERRING PROVIDER: Glendale Chard, DO  THERAPY DIAG:  Right shoulder pain, unspecified chronicity  Unsteadiness on feet  Muscle weakness  REFERRING DIAG: Balance problem [R26.89], Falls frequently [R29.6], Right arm pain [M79.601]   Rationale for Evaluation and Treatment:  Rehabilitation  SUBJECTIVE:  PERTINENT PAST HISTORY:  TII  DM, graves disease, back pain, carpal tunnel        PRECAUTIONS: Fall  WEIGHT BEARING RESTRICTIONS No  FALLS:  Has patient fallen in last 6 months? Yes, Number of falls: 4x - started with a fall down steps (unsure of cause), fell into truck and hurt R arm, fell into wall, fell out of chair  MOI/History of condition:  Onset date: May 18  SUBJECTIVE STATEMENT Up all night in pain, really uncomfortable, still numb R ring and pinky finger.  Can do some of the table slides but not much of isometrics  From referring provider: "Falls, unclear etiology. There is no evidence of myelopathy, neuropathy, or myopathy. She retains consciousness making seizures unlikely. Her exam shows mild right arm and leg weakness with normal reflexes and sensation. To further evaluation, I will order MRI brain wo contrast. Going forward, consider MRI lumbar spine."    Red flags:  Unexplained balance deficits  Pain:  Are you having pain? 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 No acute fracture or traumatic listhesis of the cervical spine. 2. Moderate multilevel degenerative disc disease, most pronounced at C5-6.  GENERAL OBSERVATION/GAIT:  Unsteady gait, forward trunk lean  SENSATION:  Light touch: Appears intact with exception of digits 4 and 5 R hand  Evaluation/Baseline:  UPPER EXTREMITY MMT:  MMT Right 08/29/2022 Left 08/29/2022  Shoulder flexion P! n  Shoulder abduction (C5) P! n  Shoulder ER 3+* n  Shoulder IR 3+* n   Middle trapezius    Lower trapezius    Shoulder extension    Grip strength    Shoulder shrug (C4)    Elbow flexion (C6)    Elbow ext (C7)    Thumb ext (C8)    Finger abd (T1)    Grossly     (Blank rows = not tested, score listed is out of 5 possible points.  N = WNL, D = diminished, C = clear for gross weakness with myotome testing, * = concordant pain with testing)  UPPER EXTREMITY A/P ROM:  ROM Right (Eval) Left (Eval)  Shoulder flexion 90*/110* 130  Shoulder abduction 80*/100* 120  Shoulder internal rotation    Shoulder external rotation    Functional IR R hip* L3  Functional ER R ear* C7  Shoulder extension    Elbow extension    Elbow flexion     (Blank rows = not tested, N = WNL, * = concordant pain with testing)     LE MMT:  MMT Right (Eval) Left (Eval)  Hip flexion (L2, L3) c c  Knee extension (L3) c c  Knee flexion    Hip abduction    Hip extension    Hip external rotation    Hip internal rotation    Hip adduction    Ankle dorsiflexion (L4) c c  Ankle plantarflexion (S1) c c  Ankle inversion    Ankle eversion    Great Toe ext (L5) c c  Grossly     (Blank rows = not tested, score listed is out of 5 possible points.  N = WNL, D = diminished, C = clear for gross weakness with myotome testing, * = concordant pain with testing)   UPPER EXTREMITY MMT:  MMT Right 08/29/2022 Left 08/29/2022  Shoulder flexion P! n  Shoulder abduction (C5) P! n  Shoulder ER 3+* n  Shoulder IR 3+* n  Middle trapezius    Lower trapezius    Shoulder extension    Grip strength    Shoulder shrug (C4)    Elbow flexion (C6)    Elbow ext (C7)    Thumb ext (C8)    Finger abd (T1)    Grossly     (Blank rows = not tested, score listed is out of 5 possible points.  N = WNL, D = diminished, C = clear for gross weakness with myotome testing, * = concordant pain with testing)   Functional Tests   Berg 40/56   PALPATION:   TTP R UT, Infra and supraspinatus muscle  belly   R/C test cluster (+)  SPECIAL TESTS:  Spurling's (-) for UE sxs  PATIENT SURVEYS:  FOTO:   TODAY'S TREATMENT  09/20/22:  Manual; Supine for stretching AAROM each shoulder, all planes.  Severely limited R shoulder especially for ER Inf/ AP glides R humeral head, gr 2 to improve jt mobility and tolerance to  movement  Kinesiotaping R shoulder to support R supraspinatus, biceps, post rotator cuff musculature. 2 I pieces, distal to prox pull, from lat and ant prox humerus, to middle and upper traps   IFC: supine for IFC B upper traps and middle traps to assist with pain management  09/17/22 Manual Therapy: to decrease muscle spasm, pain and improve mobility STM and IASTM with EDGE tool to R UT, LS rhomboids, cervical paraspinals Gentle PA mobs throacic spine - not tolerated Supine cervical traction - gentle 3 x 15 sec hold   Therapeutic Exercise: to improve strength and mobility.  Demo, verbal and tactile cues throughout for technique. Attempted wall slide with wash cloth - partial range only x 3 Seated R shoulder isometrics - flex, IR, ER, ABD and ext 10 sec hold x 5 Supine scapular retraction with B shoulder ER x 5 (pt experienced back spasms after these)  Self care: discussed pros and cons of DN. Handout provided. Pt to research and possibly be willing to try.   Moist heat x 10 min to B UT and thoracic spine at end of session   09/13/22 Manual Therapy: to decrease muscle spasm, pain and improve mobility STM to R UT, LS rhomboids R scapular mobs and gentle GH joint PROM   Therapeutic Exercise: to improve strength and mobility.  Demo, verbal and tactile cues throughout for technique. Supine AAROM chest press x 10  Supine AAROM flexion x 10 to shoulder height  Moist heat x 10 min to B UT area  09/11/22 Ultrasound: x 8 min to R upper trap 1 MHz, 1.2 w/cm2 cont to decrease inflammation/pain  Manual Therapy: to decrease muscle spasm, pain and improve mobility STM to R UT,  LS   Therapeutic Exercise: to improve strength and mobility.  Demo, verbal and tactile cues throughout for technique. Shoulder shrugs x 10  Seated R table slides flexion then across body x 10 each  IFC Estim to bil UT and cervical paraspinals x 10 min with moist heat  09/07/22 Therapeutic Exercise: to improve strength and mobility.  Demo, verbal and tactile cues throughout for technique. Isometric shoulder IR, ER - too painful, scap squeezes, very painful, table slides, too painful Therapeutic Activity:  discussing concerns, recommendations for seeing orthopedist, taking vitals, assessing symptoms, when to go to urgent care v. Er Ultrasound: x 8 min to R upper trap 1 MHz, 1.2 w/cm2 cont to decrease inflammation/pain Modalities: started to apply IFC to R shoulder when patient started to complain of dizziness and faintness, stopped, repositioned patient into reverse trendelenberg and cold compress to neck.    09/04/22 Manual Therapy: to decrease muscle spasm, pain and improve mobility STM to R UT,LS,  Therapeutic Exercise: to improve strength and mobility.  Demo, verbal and tactile cues throughout for technique.  UT and LS stretch 3x10" Wrist extension and flexion stretch 3x10" Shoulder rolls  IFC Estim to bil UT and cerival paraspinals x 10   08/31/22 Manual Therapy: to decrease muscle spasm, pain and improve mobility STM to R UT,LS, middle deltoid in sitting  Therapeutic Exercise: to improve strength and mobility.  Demo, verbal and tactile cues throughout for technique.  Seated R UT and LS stretch 2x10" R wrist ext and flex stretch 2x10" Shoulder rolls x 5 - spasm in R triceps afterward  Moist heat to R shoulder and neck x 8  min   PATIENT EDUCATION:  POC, diagnosis, and prognosis.  Pt educated via explanation, demonstration, and handout (HEP).  Pt confirms understanding verbally.  HOME EXERCISE PROGRAM: Access Code: [email or web address] URL: [email or web address] Date:  08/31/2022 Prepared by: Verta Ellen  Exercises - Seated Upper Trapezius Stretch  - 1 x daily - 7 x weekly - 3 sets - 10 sec hold - Gentle Levator Scapulae Stretch  - 1 x daily - 7 x weekly - 3 sets - 10 sec hold - Seated Wrist Flexion Stretch  - 1 x daily - 7 x weekly - 3 sets - 10 sec hold - Seated Wrist Extension Stretch  - 1 x daily - 7 x weekly - 3 sets - 10 sec hold  ASSESSMENT:  CLINICAL IMPRESSION: Donna Cowan continues to have marked pain in the R upper quadrant and neck. Very limited, intolerant to R shoulder PROM in particula, also L UE.  Maintains R arm against trunk adducted with elbow flexed and supports with L hand.  May be developing adhesive capsulitis evidenced by constant resting pain and limited ER R shoulder.  Encouraged to discuss with her primary MD who she sees today after this appt.  She is not clear on whether she is gaining any traction or improvement with the skilled PT.  Able to partially perform the isometrics.  May benefit from aquatics?  Will assess again next week after she sees her PCP today. Patient does not want DN she stated today.   OBJECTIVE IMPAIRMENTS: Pain, shoulder ROM, shoulder strength, balance  ACTIVITY LIMITATIONS: work, housework, lifting, reaching  PERSONAL FACTORS: See medical history and pertinent history   REHAB POTENTIAL: Fair unclear etiology of balance problem  CLINICAL DECISION MAKING: Evolving/moderate complexity  EVALUATION COMPLEXITY: Moderate   GOALS:   SHORT TERM GOALS: Target date: 09/26/2022   Donna Cowan will be >75% HEP compliant to improve carryover between sessions and facilitate independent management of condition  Evaluation: ongoing Goal status: IN PROGRESS   LONG TERM GOALS: Target date: 10/24/2022   Donna Cowan will improve FOTO score to goal as a proxy for functional improvement  Evaluation/Baseline: TAKE VISIT 2 Goal status: INITIAL    2.  Donna Cowan will imporve BERG balance score to 47 pts, to  show a significant improvement in balance in order to reduce fall risk  Evaluation/Baseline: 40 pts Goal status: INITIAL   3.  Donna Cowan will improve 30'' STS (MCID 2) to >/= 8x (w/ UE?: Y) to show improved LE strength and improved transfers   Evaluation/Baseline: 4x  w/ UE? Y Goal status: INITIAL   4.  Donna Cowan will self report >/= 50% decrease in shoulder pain from evaluation   Evaluation/Baseline: 10/10 max pain Goal status: INITIAL  5.  Donna Cowan will improve the following MMTs to >/= 4+/5 to show improvement in strength:      Goal status: INITIAL    PLAN: PT FREQUENCY: 1-2x/week  PT DURATION: 8 weeks  PLANNED INTERVENTIONS: Therapeutic exercises, Aquatic therapy, Therapeutic activity, Neuro Muscular re-education, Gait training, Patient/Family education, Joint mobilization, Dry Needling, Electrical stimulation, Spinal mobilization and/or manipulation, Moist heat, Taping, Vasopneumatic device, Ionotophoresis 4mg /ml Dexamethasone, and Manual therapy  PLAN FOR NEXT SESSION:   continue with manual and pain control; gentle movements of scapula and shoulder    Frazier Richards, PT, DPT Board-certified Specialist in Orthopaedic Physical Therapy  09/20/2022, 4:16 PM

## 2022-09-25 ENCOUNTER — Other Ambulatory Visit: Payer: Self-pay

## 2022-09-25 ENCOUNTER — Ambulatory Visit: Payer: 59

## 2022-09-25 DIAGNOSIS — R2681 Unsteadiness on feet: Secondary | ICD-10-CM

## 2022-09-25 DIAGNOSIS — M6281 Muscle weakness (generalized): Secondary | ICD-10-CM

## 2022-09-25 DIAGNOSIS — M25511 Pain in right shoulder: Secondary | ICD-10-CM

## 2022-09-25 NOTE — Therapy (Signed)
OUTPATIENT PHYSICAL THERAPY TREATMENT  Patient Name: Donna Cowan MRN: [id number] DOB:14-Oct-1965, 57 y.o., female Today's Date: 09/25/2022   PT End of Session - 09/25/22 1753     Visit Number 9    Date for PT Re-Evaluation 10/24/22    Authorization Type [employer]    PT Start Time 1530    PT Stop Time 1615    PT Time Calculation (min) 45 min    Behavior During Therapy [facility] for tasks assessed/performed                    Past Medical History:  Diagnosis Date   Back pain    Chronic back pain    Chronic neck pain    Diabetes (HCC)    Edema of both lower extremities    Hypothyroidism    Joint pain    Lactose intolerance    Sleep apnea    Thyroid disease    Past Surgical History:  Procedure Laterality Date   THYROID SURGERY     TUBAL LIGATION     Patient Active Problem List   Diagnosis Date Noted   Anger reaction 05/09/2021   Family discord 05/09/2021   PTSD (post-traumatic stress disorder) 05/09/2021   Chronic right-sided headache 07/19/2020   Right sided temporal headache 07/14/2020   Carpal tunnel syndrome 06/20/2020   Leg swelling 06/20/2020   Ankle strain 06/20/2020   Tinnitus of right ear 05/19/2020   Class 1 obesity due to excess calories with body mass index (BMI) of 31.0 to 31.9 in adult 08/12/2019   Bereavement reaction 03/29/2019   Type 2 diabetes mellitus without complications (HCC) 03/29/2019   Graves disease 03/29/2019   Healthcare maintenance 03/29/2019   Vitamin D deficiency 03/29/2019   Back injury, sequela 03/29/2019    PCP: Leilani Able, MD  REFERRING PROVIDER: Glendale Chard, DO  THERAPY DIAG:  Right shoulder pain, unspecified chronicity  Unsteadiness on feet  Muscle weakness  REFERRING DIAG: Balance problem [R26.89], Falls frequently [R29.6], Right arm pain [M79.601]   Rationale for Evaluation and Treatment:  Rehabilitation  SUBJECTIVE:  PERTINENT PAST HISTORY:  TII DM, graves disease, back pain, carpal tunnel         PRECAUTIONS: Fall  WEIGHT BEARING RESTRICTIONS No  FALLS:  Has patient fallen in last 6 months? Yes, Number of falls: 4x - started with a fall down steps (unsure of cause), fell into truck and hurt R arm, fell into wall, fell out of chair  MOI/History of condition:  Onset date: May 18  SUBJECTIVE STATEMENT Up all night in pain, really uncomfortable, still numb R ring and pinky finger.  Can do some of the table slides but not much of isometrics  From referring provider: "Falls, unclear etiology. There is no evidence of myelopathy, neuropathy, or myopathy. She retains consciousness making seizures unlikely. Her exam shows mild right arm and leg weakness with normal reflexes and sensation. To further evaluation, I will order MRI brain wo contrast. Going forward, consider MRI lumbar spine."    Red flags:  Unexplained balance deficits  Pain:  Are you having pain? 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 No acute fracture or traumatic listhesis of the cervical spine. 2. Moderate multilevel degenerative disc disease, most pronounced at C5-6.  GENERAL OBSERVATION/GAIT:  Unsteady gait, forward trunk lean  SENSATION:  Light touch: Appears intact with exception of digits 4 and 5 R hand  Evaluation/Baseline:  UPPER EXTREMITY MMT:  MMT Right 08/29/2022 Left 08/29/2022  Shoulder flexion P! n  Shoulder abduction (C5) P! n  Shoulder ER 3+* n  Shoulder IR 3+* n  Middle trapezius    Lower trapezius    Shoulder  extension    Grip strength    Shoulder shrug (C4)    Elbow flexion (C6)    Elbow ext (C7)    Thumb ext (C8)    Finger abd (T1)    Grossly     (Blank rows = not tested, score listed is out of 5 possible points.  N = WNL, D = diminished, C = clear for gross weakness with myotome testing, * = concordant pain with testing)  UPPER EXTREMITY A/P ROM:  ROM Right (Eval) Left (Eval)  Shoulder flexion 90*/110* 130  Shoulder abduction 80*/100* 120  Shoulder internal rotation    Shoulder external rotation    Functional IR R hip* L3  Functional ER R ear* C7  Shoulder extension    Elbow extension    Elbow flexion     (Blank rows = not tested, N = WNL, * = concordant pain with testing)     LE MMT:  MMT Right (Eval) Left (Eval)  Hip flexion (L2, L3) c c  Knee extension (L3) c c  Knee flexion    Hip abduction    Hip extension    Hip external rotation    Hip internal rotation    Hip adduction    Ankle dorsiflexion (L4) c c  Ankle plantarflexion (S1) c c  Ankle inversion    Ankle eversion    Great Toe ext (L5) c c  Grossly     (Blank rows = not tested, score listed is out of 5 possible points.  N = WNL, D = diminished, C = clear for gross weakness with myotome testing, * = concordant pain with testing)   UPPER EXTREMITY MMT:  MMT Right 08/29/2022 Left 08/29/2022  Shoulder flexion P! n  Shoulder abduction (C5) P! n  Shoulder ER 3+* n  Shoulder IR 3+* n  Middle trapezius    Lower trapezius    Shoulder extension    Grip strength    Shoulder shrug (C4)    Elbow flexion (C6)    Elbow ext (C7)    Thumb ext (C8)    Finger abd (T1)    Grossly     (Blank rows = not tested, score listed is out of 5 possible points.  N = WNL, D = diminished, C = clear for gross weakness with myotome testing, * = concordant pain with testing)   Functional Tests   Berg 40/56   PALPATION:   TTP R UT, Infra and supraspinatus muscle belly   R/C test cluster (+)  SPECIAL  TESTS:  Spurling's (-) for UE sxs  PATIENT SURVEYS:  FOTO:   TODAY'S TREATMENT  09/25/22: Manual:  supine for alternating bouts of cervical distraction, L upper traps and L levator, B cervical occipitals , with isometrics, 5 sec bouts. 3 sets Kinesiotaping R shoulder, 2 -I pieces, one ant upper humerus to middle traps, one lateral humerus  to upper traps, 35% pull, designed to address /assist rotator cuff, post R shoulder musculature  Iontophoresis, 80 mam, 0.04% dexamethasome, R shoulder over biceps long head insertion, advised to remove 3 hrs post appt.  Inst to perform L shoulder pendulum at intervals to maintain her flexibility. 09/20/22:  Manual; Supine for stretching AAROM each shoulder, all planes.  Severely limited R shoulder especially for ER Inf/ AP glides R humeral head, gr 2 to improve jt mobility and tolerance to movement  Kinesiotaping R shoulder to support R supraspinatus, biceps, post rotator cuff musculature. 2 I pieces, distal to prox pull, from lat and ant prox humerus, to middle and upper traps   IFC: supine for IFC B upper traps and middle traps to assist with pain management  09/17/22 Manual Therapy: to decrease muscle spasm, pain and improve mobility STM and IASTM with EDGE tool to R UT, LS rhomboids, cervical paraspinals Gentle PA mobs throacic spine - not tolerated Supine cervical traction - gentle 3 x 15 sec hold   Therapeutic Exercise: to improve strength and mobility.  Demo, verbal and tactile cues throughout for technique. Attempted wall slide with wash cloth - partial range only x 3 Seated R shoulder isometrics - flex, IR, ER, ABD and ext 10 sec hold x 5 Supine scapular retraction with B shoulder ER x 5 (pt experienced back spasms after these)  Self care: discussed pros and cons of DN. Handout provided. Pt to research and possibly be willing to try.   Moist heat x 10 min to B UT and thoracic spine at end of session   09/13/22 Manual Therapy: to decrease  muscle spasm, pain and improve mobility STM to R UT, LS rhomboids R scapular mobs and gentle GH joint PROM   Therapeutic Exercise: to improve strength and mobility.  Demo, verbal and tactile cues throughout for technique. Supine AAROM chest press x 10  Supine AAROM flexion x 10 to shoulder height  Moist heat x 10 min to B UT area  09/11/22 Ultrasound: x 8 min to R upper trap 1 MHz, 1.2 w/cm2 cont to decrease inflammation/pain  Manual Therapy: to decrease muscle spasm, pain and improve mobility STM to R UT, LS   Therapeutic Exercise: to improve strength and mobility.  Demo, verbal and tactile cues throughout for technique. Shoulder shrugs x 10  Seated R table slides flexion then across body x 10 each  IFC Estim to bil UT and cervical paraspinals x 10 min with moist heat  09/07/22 Therapeutic Exercise: to improve strength and mobility.  Demo, verbal and tactile cues throughout for technique. Isometric shoulder IR, ER - too painful, scap squeezes, very painful, table slides, too painful Therapeutic Activity:  discussing concerns, recommendations for seeing orthopedist, taking vitals, assessing symptoms, when to go to urgent care v. Er Ultrasound: x 8 min to R upper trap 1 MHz, 1.2 w/cm2 cont to decrease inflammation/pain Modalities: started to apply IFC to R shoulder when patient started to complain of dizziness and faintness, stopped, repositioned patient into reverse trendelenberg and cold compress to neck.    09/04/22 Manual Therapy: to decrease muscle spasm, pain and improve mobility STM to R UT,LS,  Therapeutic Exercise: to improve strength and mobility.  Demo, verbal and tactile cues throughout for technique.  UT and LS stretch 3x10" Wrist extension and flexion stretch 3x10" Shoulder rolls  IFC Estim to bil UT and cerival paraspinals x 10   08/31/22 Manual Therapy: to decrease muscle spasm, pain and improve mobility STM to R  UT,LS, middle deltoid in sitting  Therapeutic  Exercise: to improve strength and mobility.  Demo, verbal and tactile cues throughout for technique.  Seated R UT and LS stretch 2x10" R wrist ext and flex stretch 2x10" Shoulder rolls x 5 - spasm in R triceps afterward  Moist heat to R shoulder and neck x 8  min   PATIENT EDUCATION:  POC, diagnosis, and prognosis.  Pt educated via explanation, demonstration, and handout (HEP).  Pt confirms understanding verbally.   HOME EXERCISE PROGRAM: Access Code: [email or web address] URL: [email or web address] Date: 08/31/2022 Prepared by: Verta Ellen  Exercises - Seated Upper Trapezius Stretch  - 1 x daily - 7 x weekly - 3 sets - 10 sec hold - Gentle Levator Scapulae Stretch  - 1 x daily - 7 x weekly - 3 sets - 10 sec hold - Seated Wrist Flexion Stretch  - 1 x daily - 7 x weekly - 3 sets - 10 sec hold - Seated Wrist Extension Stretch  - 1 x daily - 7 x weekly - 3 sets - 10 sec hold  ASSESSMENT:  CLINICAL IMPRESSION: Fontaine continues to have marked pain in the R upper quadrant and neck. Very limited, intolerant to R shoulder PROM in particula, also L UE. Tender R supraspinatus, r long head biceps.  Very poor tolerance to movement /even gentle stretching R shoulder, increases her pain. Likes, responded well to taping last visit. So utiiized again today. Advised her to seek orthopedist to eval R shoulder. Moved R shoulder actively today to 80 degrees elevation when donning, doffing shirt.  May be developing adhesive capsulitis evidenced by constant resting pain and limited ER R shoulder. She is not clear on whether she is gaining any traction or improvement with the skilled PT. Advised again to seek medical follow up of R shoulder.   OBJECTIVE IMPAIRMENTS: Pain, shoulder ROM, shoulder strength, balance  ACTIVITY LIMITATIONS: work, housework, lifting, reaching  PERSONAL FACTORS: See medical history and pertinent history   REHAB POTENTIAL: Fair unclear etiology of balance  problem  CLINICAL DECISION MAKING: Evolving/moderate complexity  EVALUATION COMPLEXITY: Moderate   GOALS:   SHORT TERM GOALS: Target date: 09/26/2022   Wealtha will be >75% HEP compliant to improve carryover between sessions and facilitate independent management of condition  Evaluation: ongoing Goal status: IN PROGRESS   LONG TERM GOALS: Target date: 10/24/2022   Sihaam will improve FOTO score to goal as a proxy for functional improvement  Evaluation/Baseline: TAKE VISIT 2 Goal status: INITIAL    2.  Deajah will imporve BERG balance score to 47 pts, to show a significant improvement in balance in order to reduce fall risk  Evaluation/Baseline: 40 pts Goal status: INITIAL   3.  Kandance will improve 30'' STS (MCID 2) to >/= 8x (w/ UE?: Y) to show improved LE strength and improved transfers   Evaluation/Baseline: 4x  w/ UE? Y Goal status: INITIAL   4.  Catha will self report >/= 50% decrease in shoulder pain from evaluation   Evaluation/Baseline: 10/10 max pain Goal status: INITIAL  5.  Kalsey will improve the following MMTs to >/= 4+/5 to show improvement in strength:      Goal status: INITIAL    PLAN: PT FREQUENCY: 1-2x/week  PT DURATION: 8 weeks  PLANNED INTERVENTIONS: Therapeutic exercises, Aquatic therapy, Therapeutic activity, Neuro Muscular re-education, Gait training, Patient/Family education, Joint mobilization, Dry Needling, Electrical stimulation, Spinal mobilization and/or manipulation, Moist heat, Taping, Vasopneumatic device, Ionotophoresis 4mg /ml Dexamethasone, and Manual therapy  PLAN FOR NEXT  SESSION:   continue with manual and pain control; gentle movements of scapula and shoulder    Frazier Richards, PT, DPT Board-certified Specialist in Orthopaedic Physical Therapy  09/25/2022, 6:06 PM

## 2022-09-28 ENCOUNTER — Ambulatory Visit: Payer: 59

## 2022-09-28 DIAGNOSIS — M25511 Pain in right shoulder: Secondary | ICD-10-CM | POA: Diagnosis not present

## 2022-09-28 DIAGNOSIS — M5459 Other low back pain: Secondary | ICD-10-CM

## 2022-09-28 DIAGNOSIS — R2681 Unsteadiness on feet: Secondary | ICD-10-CM

## 2022-09-28 DIAGNOSIS — M6281 Muscle weakness (generalized): Secondary | ICD-10-CM

## 2022-09-28 NOTE — Therapy (Signed)
OUTPATIENT PHYSICAL THERAPY TREATMENT  Patient Name: Donna Cowan MRN: [id number] DOB:31-Aug-1965, 57 y.o., female Today's Date: 09/28/2022   PT End of Session - 09/28/22 0942     Visit Number 10    Date for PT Re-Evaluation 10/24/22    Authorization Type [employer]    PT Start Time 0935    PT Stop Time 1023    PT Time Calculation (min) 48 min    Activity Tolerance Patient tolerated treatment well    Behavior During Therapy [facility] for tasks assessed/performed                     Past Medical History:  Diagnosis Date   Back pain    Chronic back pain    Chronic neck pain    Diabetes (HCC)    Edema of both lower extremities    Hypothyroidism    Joint pain    Lactose intolerance    Sleep apnea    Thyroid disease    Past Surgical History:  Procedure Laterality Date   THYROID SURGERY     TUBAL LIGATION     Patient Active Problem List   Diagnosis Date Noted   Anger reaction 05/09/2021   Family discord 05/09/2021   PTSD (post-traumatic stress disorder) 05/09/2021   Chronic right-sided headache 07/19/2020   Right sided temporal headache 07/14/2020   Carpal tunnel syndrome 06/20/2020   Leg swelling 06/20/2020   Ankle strain 06/20/2020   Tinnitus of right ear 05/19/2020   Class 1 obesity due to excess calories with body mass index (BMI) of 31.0 to 31.9 in adult 08/12/2019   Bereavement reaction 03/29/2019   Type 2 diabetes mellitus without complications (HCC) 03/29/2019   Graves disease 03/29/2019   Healthcare maintenance 03/29/2019   Vitamin D deficiency 03/29/2019   Back injury, sequela 03/29/2019    PCP: Leilani Able, MD  REFERRING PROVIDER: Leilani Able, MD  THERAPY DIAG:  Right shoulder pain, unspecified chronicity  Unsteadiness on feet  Muscle weakness  Other low back pain  REFERRING DIAG: Balance problem [R26.89], Falls frequently [R29.6], Right arm pain [M79.601]   Rationale for Evaluation and Treatment:   Rehabilitation  SUBJECTIVE:  PERTINENT PAST HISTORY:  TII DM, graves disease, back pain, carpal tunnel        PRECAUTIONS: Fall  WEIGHT BEARING RESTRICTIONS No  FALLS:  Has patient fallen in last 6 months? Yes, Number of falls: 4x - started with a fall down steps (unsure of cause), fell into truck and hurt R arm, fell into wall, fell out of chair  MOI/History of condition:  Onset date: May 18  SUBJECTIVE STATEMENT R shoulder has been doing better ever since we started taping it. Numbness in R hand is less severe  From referring provider: "Falls, unclear etiology. There is no evidence of myelopathy, neuropathy, or myopathy. She retains consciousness making seizures unlikely. Her exam shows mild right arm and leg weakness with normal reflexes and sensation. To further evaluation, I will order MRI brain wo contrast. Going forward, consider MRI lumbar spine."    Red flags:  Unexplained balance deficits  Pain:  Are you having pain? 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 No acute fracture or traumatic listhesis of the cervical spine. 2. Moderate multilevel degenerative disc disease, most pronounced at C5-6.  GENERAL OBSERVATION/GAIT:  Unsteady gait, forward trunk lean  SENSATION:  Light touch: Appears intact with exception of digits 4 and 5 R hand  Evaluation/Baseline:  UPPER EXTREMITY MMT:  MMT Right 08/29/2022 Left 08/29/2022  Shoulder flexion P! n  Shoulder abduction (C5) P! n  Shoulder ER 3+* n   Shoulder IR 3+* n  Middle trapezius    Lower trapezius    Shoulder extension    Grip strength    Shoulder shrug (C4)    Elbow flexion (C6)    Elbow ext (C7)    Thumb ext (C8)    Finger abd (T1)    Grossly     (Blank rows = not tested, score listed is out of 5 possible points.  N = WNL, D = diminished, C = clear for gross weakness with myotome testing, * = concordant pain with testing)  UPPER EXTREMITY A/P ROM:  ROM Right (Eval) Left (Eval)  Shoulder flexion 90*/110* 130  Shoulder abduction 80*/100* 120  Shoulder internal rotation    Shoulder external rotation    Functional IR R hip* L3  Functional ER R ear* C7  Shoulder extension    Elbow extension    Elbow flexion     (Blank rows = not tested, N = WNL, * = concordant pain with testing)     LE MMT:  MMT Right (Eval) Left (Eval)  Hip flexion (L2, L3) c c  Knee extension (L3) c c  Knee flexion    Hip abduction    Hip extension    Hip external rotation    Hip internal rotation    Hip adduction    Ankle dorsiflexion (L4) c c  Ankle plantarflexion (S1) c c  Ankle inversion    Ankle eversion    Great Toe ext (L5) c c  Grossly     (Blank rows = not tested, score listed is out of 5 possible points.  N = WNL, D = diminished, C = clear for gross weakness with myotome testing, * = concordant pain with testing)   UPPER EXTREMITY MMT:  MMT Right 08/29/2022 Left 08/29/2022 Right 09/28/22  Shoulder flexion P! n 4  Shoulder abduction (C5) P! n 4- p!  Shoulder ER 3+* n 4- p!  Shoulder IR 3+* n 4+  Middle trapezius     Lower trapezius     Shoulder extension     Grip strength     Shoulder shrug (C4)     Elbow flexion (C6)     Elbow ext (C7)     Thumb ext (C8)     Finger abd (T1)     Grossly      (Blank rows = not tested, score listed is out of 5 possible points.  N = WNL, D = diminished, C = clear for gross weakness with myotome testing, * = concordant pain with testing)   Functional Tests   Berg  40/56   PALPATION:   TTP R UT, Infra and supraspinatus muscle belly   R/C test cluster (+)  SPECIAL TESTS:  Spurling's (-) for UE sxs  PATIENT SURVEYS:  FOTO:   TODAY'S TREATMENT  09/28/22 Therpeutic Activity: Assessed LTGs see under goals and objective info  Kinesiotaping R shoulder, 2 -I pieces,  one ant upper humerus to middle traps, one lateral humerus to upper traps, 35% pull, designed to address /assist rotator cuff, post R shoulder musculature  09/25/22: Manual:  supine for alternating bouts of cervical distraction, L upper traps and L levator, B cervical occipitals , with isometrics, 5 sec bouts. 3 sets Kinesiotaping R shoulder, 2 -I pieces, one ant upper humerus to middle traps, one lateral humerus to upper traps, 35% pull, designed to address /assist rotator cuff, post R shoulder musculature  Iontophoresis, 80 mam, 0.04% dexamethasome, R shoulder over biceps long head insertion, advised to remove 3 hrs post appt.  Inst to perform L shoulder pendulum at intervals to maintain her flexibility. 09/20/22:  Manual; Supine for stretching AAROM each shoulder, all planes.  Severely limited R shoulder especially for ER Inf/ AP glides R humeral head, gr 2 to improve jt mobility and tolerance to movement  Kinesiotaping R shoulder to support R supraspinatus, biceps, post rotator cuff musculature. 2 I pieces, distal to prox pull, from lat and ant prox humerus, to middle and upper traps   IFC: supine for IFC B upper traps and middle traps to assist with pain management  09/17/22 Manual Therapy: to decrease muscle spasm, pain and improve mobility STM and IASTM with EDGE tool to R UT, LS rhomboids, cervical paraspinals Gentle PA mobs throacic spine - not tolerated Supine cervical traction - gentle 3 x 15 sec hold   Therapeutic Exercise: to improve strength and mobility.  Demo, verbal and tactile cues throughout for technique. Attempted wall slide with wash cloth - partial range only x  3 Seated R shoulder isometrics - flex, IR, ER, ABD and ext 10 sec hold x 5 Supine scapular retraction with B shoulder ER x 5 (pt experienced back spasms after these)  Self care: discussed pros and cons of DN. Handout provided. Pt to research and possibly be willing to try.   Moist heat x 10 min to B UT and thoracic spine at end of session   09/13/22 Manual Therapy: to decrease muscle spasm, pain and improve mobility STM to R UT, LS rhomboids R scapular mobs and gentle GH joint PROM   Therapeutic Exercise: to improve strength and mobility.  Demo, verbal and tactile cues throughout for technique. Supine AAROM chest press x 10  Supine AAROM flexion x 10 to shoulder height  Moist heat x 10 min to B UT area  09/11/22 Ultrasound: x 8 min to R upper trap 1 MHz, 1.2 w/cm2 cont to decrease inflammation/pain  Manual Therapy: to decrease muscle spasm, pain and improve mobility STM to R UT, LS   Therapeutic Exercise: to improve strength and mobility.  Demo, verbal and tactile cues throughout for technique. Shoulder shrugs x 10  Seated R table slides flexion then across body x 10 each  IFC Estim to bil UT and cervical paraspinals x 10 min with moist heat  09/07/22 Therapeutic Exercise: to improve strength and mobility.  Demo, verbal and tactile cues throughout for technique. Isometric shoulder IR, ER - too painful, scap squeezes, very painful, table slides, too painful Therapeutic Activity:  discussing concerns, recommendations for seeing orthopedist, taking vitals, assessing symptoms, when to go to urgent care v. Er Ultrasound: x 8 min to R upper trap 1 MHz, 1.2 w/cm2 cont to decrease inflammation/pain Modalities: started to apply IFC to R shoulder when patient started to complain of dizziness and faintness, stopped, repositioned patient into reverse trendelenberg and cold compress to neck.    09/04/22 Manual Therapy: to  decrease muscle spasm, pain and improve mobility STM to R UT,LS,   Therapeutic Exercise: to improve strength and mobility.  Demo, verbal and tactile cues throughout for technique.  UT and LS stretch 3x10" Wrist extension and flexion stretch 3x10" Shoulder rolls  IFC Estim to bil UT and cerival paraspinals x 10   08/31/22 Manual Therapy: to decrease muscle spasm, pain and improve mobility STM to R UT,LS, middle deltoid in sitting  Therapeutic Exercise: to improve strength and mobility.  Demo, verbal and tactile cues throughout for technique.  Seated R UT and LS stretch 2x10" R wrist ext and flex stretch 2x10" Shoulder rolls x 5 - spasm in R triceps afterward  Moist heat to R shoulder and neck x 8  min   PATIENT EDUCATION:  POC, diagnosis, and prognosis.  Pt educated via explanation, demonstration, and handout (HEP).  Pt confirms understanding verbally.   HOME EXERCISE PROGRAM: Access Code: [email or web address] URL: [email or web address] Date: 08/31/2022 Prepared by: Verta Ellen  Exercises - Seated Upper Trapezius Stretch  - 1 x daily - 7 x weekly - 3 sets - 10 sec hold - Gentle Levator Scapulae Stretch  - 1 x daily - 7 x weekly - 3 sets - 10 sec hold - Seated Wrist Flexion Stretch  - 1 x daily - 7 x weekly - 3 sets - 10 sec hold - Seated Wrist Extension Stretch  - 1 x daily - 7 x weekly - 3 sets - 10 sec hold  ASSESSMENT:  CLINICAL IMPRESSION: Girtrue shows improved 30 second STS score, R shoulder strength, met goal for BERG balance test and shown improvement in overall pin levels. She is progressing well with all of her goals and met LTG #2. Did KT tape again for shoulder stability and pain. Ended session with moist heat for 8 min, she did start to feel dizzy when sitting up but this went away when she was reclined. She shows good improvement and continued potential for, continues to benefit from skilled therapy. OBJECTIVE IMPAIRMENTS: Pain, shoulder ROM, shoulder strength, balance  ACTIVITY LIMITATIONS: work, housework, lifting,  reaching  PERSONAL FACTORS: See medical history and pertinent history   REHAB POTENTIAL: Fair unclear etiology of balance problem  CLINICAL DECISION MAKING: Evolving/moderate complexity  EVALUATION COMPLEXITY: Moderate   GOALS:   SHORT TERM GOALS: Target date: 09/26/2022   Maritta will be >75% HEP compliant to improve carryover between sessions and facilitate independent management of condition  Evaluation: ongoing Goal status: MET- 09/28/22   LONG TERM GOALS: Target date: 10/24/2022   Saily will improve FOTO score to goal as a proxy for functional improvement  Evaluation/Baseline: TAKE VISIT 2 Goal status: INITIAL    2.  Elener will imporve BERG balance score to 47 pts, to show a significant improvement in balance in order to reduce fall risk  Evaluation/Baseline: 40 pts Goal status: MET- 54 / 56 = 96.4 %   3.  Carre will improve 30'' STS (MCID 2) to >/= 8x (w/ UE?: Y) to show improved LE strength and improved transfers   Evaluation/Baseline: 4x  w/ UE? Y Goal status: IN PROGRESS - 7x with UE support   4.  Jeilin will self report >/= 50% decrease in shoulder pain from evaluation   Evaluation/Baseline: 10/10 max pain Goal status: IN PROGRESS- 40%  5.  Novena will improve the following MMTs to >/= 4+/5 to show improvement in strength:      Goal status: IN PROGRESS - see chart    PLAN: PT FREQUENCY:  1-2x/week  PT DURATION: 8 weeks  PLANNED INTERVENTIONS: Therapeutic exercises, Aquatic therapy, Therapeutic activity, Neuro Muscular re-education, Gait training, Patient/Family education, Joint mobilization, Dry Needling, Electrical stimulation, Spinal mobilization and/or manipulation, Moist heat, Taping, Vasopneumatic device, Ionotophoresis 4mg /ml Dexamethasone, and Manual therapy  PLAN FOR NEXT SESSION:   continue with manual and pain control; gentle movements of scapula and shoulder  Myasia Sinatra L Rama Mcclintock, PTA  09/28/2022, 11:45 AM

## 2022-10-02 ENCOUNTER — Ambulatory Visit: Payer: 59

## 2022-10-02 ENCOUNTER — Other Ambulatory Visit: Payer: Self-pay

## 2022-10-02 DIAGNOSIS — R2681 Unsteadiness on feet: Secondary | ICD-10-CM

## 2022-10-02 DIAGNOSIS — M25511 Pain in right shoulder: Secondary | ICD-10-CM

## 2022-10-02 DIAGNOSIS — M5459 Other low back pain: Secondary | ICD-10-CM

## 2022-10-02 DIAGNOSIS — M6281 Muscle weakness (generalized): Secondary | ICD-10-CM

## 2022-10-02 NOTE — Therapy (Signed)
OUTPATIENT PHYSICAL THERAPY TREATMENT  Patient Name: Donna Cowan MRN: [id number] DOB:07-31-1965, 57 y.o., female Today's Date: 10/02/2022   PT End of Session - 10/02/22 1544     Visit Number 11    Date for PT Re-Evaluation 10/24/22    Authorization Type [employer]    PT Start Time 1445    PT Stop Time 1528    PT Time Calculation (min) 43 min    Activity Tolerance Patient tolerated treatment well    Behavior During Therapy WFL for tasks assessed/performed                      Past Medical History:  Diagnosis Date   Back pain    Chronic back pain    Chronic neck pain    Diabetes (HCC)    Edema of both lower extremities    Hypothyroidism    Joint pain    Lactose intolerance    Sleep apnea    Thyroid disease    Past Surgical History:  Procedure Laterality Date   THYROID SURGERY     TUBAL LIGATION     Patient Active Problem List   Diagnosis Date Noted   Anger reaction 05/09/2021   Family discord 05/09/2021   PTSD (post-traumatic stress disorder) 05/09/2021   Chronic right-sided headache 07/19/2020   Right sided temporal headache 07/14/2020   Carpal tunnel syndrome 06/20/2020   Leg swelling 06/20/2020   Ankle strain 06/20/2020   Tinnitus of right ear 05/19/2020   Class 1 obesity due to excess calories with body mass index (BMI) of 31.0 to 31.9 in adult 08/12/2019   Bereavement reaction 03/29/2019   Type 2 diabetes mellitus without complications (HCC) 03/29/2019   Graves disease 03/29/2019   Healthcare maintenance 03/29/2019   Vitamin D deficiency 03/29/2019   Back injury, sequela 03/29/2019    PCP: Leilani Able, MD  REFERRING PROVIDER: Glendale Chard, DO  THERAPY DIAG:  Right shoulder pain, unspecified chronicity  Unsteadiness on feet  Muscle weakness  Other low back pain  REFERRING DIAG: Balance problem [R26.89], Falls frequently [R29.6], Right arm pain [M79.601]   Rationale for Evaluation and Treatment:   Rehabilitation  SUBJECTIVE:  PERTINENT PAST HISTORY:  TII DM, graves disease, back pain, carpal tunnel        PRECAUTIONS: Fall  WEIGHT BEARING RESTRICTIONS No  FALLS:  Has patient fallen in last 6 months? Yes, Number of falls: 4x - started with a fall down steps (unsure of cause), fell into truck and hurt R arm, fell into wall, fell out of chair  MOI/History of condition:  Onset date: May 18  SUBJECTIVE STATEMENT Patient states again today R shoulder has been doing better ever since we started taping it. Numbness in R hand is less severe. She will be going to a new orthopedic appt at the end of this week.  From referring provider: "Falls, unclear etiology. There is no evidence of myelopathy, neuropathy, or myopathy. She retains consciousness making seizures unlikely. Her exam shows mild right arm and leg weakness with normal reflexes and sensation. To further evaluation, I will order MRI brain wo contrast. Going forward, consider MRI lumbar spine."    Red flags:  Unexplained balance deficits  Pain:  Are you having pain? 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 No acute fracture or traumatic listhesis of the cervical spine. 2. Moderate multilevel degenerative disc disease, most pronounced at C5-6.  GENERAL OBSERVATION/GAIT:  Unsteady gait, forward trunk lean  SENSATION:  Light touch: Appears intact with exception of digits 4 and 5 R hand  Evaluation/Baseline:  UPPER EXTREMITY MMT:  MMT  Right 08/29/2022 Left 08/29/2022  Shoulder flexion P! n  Shoulder abduction (C5) P! n  Shoulder ER 3+* n  Shoulder IR 3+* n  Middle trapezius    Lower trapezius    Shoulder extension    Grip strength    Shoulder shrug (C4)    Elbow flexion (C6)    Elbow ext (C7)    Thumb ext (C8)    Finger abd (T1)    Grossly     (Blank rows = not tested, score listed is out of 5 possible points.  N = WNL, D = diminished, C = clear for gross weakness with myotome testing, * = concordant pain with testing)  UPPER EXTREMITY A/P ROM:  ROM Right (Eval) Left (Eval) Right AROM 10/02/22  Shoulder flexion 90*/110* 130 90  Shoulder abduction 80*/100* 120 90  Shoulder internal rotation   Hand to R PSIS  Shoulder external rotation   80  Functional IR R hip* L3   Functional ER R ear* C7 T1  Shoulder extension     Elbow extension     Elbow flexion      (Blank rows = not tested, N = WNL, * = concordant pain with testing)     LE MMT:  MMT Right (Eval) Left (Eval)  Hip flexion (L2, L3) c c  Knee extension (L3) c c  Knee flexion    Hip abduction    Hip extension    Hip external rotation    Hip internal rotation    Hip adduction    Ankle dorsiflexion (L4) c c  Ankle plantarflexion (S1) c c  Ankle inversion    Ankle eversion    Great Toe ext (L5) c c  Grossly     (Blank rows = not tested, score listed is out of 5 possible points.  N = WNL, D = diminished, C = clear for gross weakness with myotome testing, * = concordant pain with testing)   UPPER EXTREMITY MMT:  MMT Right 08/29/2022 Left 08/29/2022 Right 09/28/22  Shoulder flexion P! n 4  Shoulder abduction (C5) P! n 4- p!  Shoulder ER 3+* n 4- p!  Shoulder IR 3+* n 4+  Middle trapezius     Lower trapezius     Shoulder extension     Grip strength     Shoulder shrug (C4)     Elbow flexion (C6)     Elbow ext (C7)     Thumb ext (C8)     Finger abd (T1)     Grossly      (Blank rows = not tested, score listed is out of 5 possible  points.  N = WNL, D = diminished, C = clear for gross weakness with myotome testing, * = concordant pain with testing)   Functional Tests   Berg 40/56   PALPATION:   TTP R UT, Infra and supraspinatus muscle belly   R/C test cluster (+)  SPECIAL TESTS:  Spurling's (-) for UE sxs  PATIENT SURVEYS:  FOTO:   TODAY'S TREATMENT   10/02/22: Therapeutic exercise:  Kinesiotaping:  Applied 2 -I pieces of kinesiotape to enhance centralization of humeral head and assist with stability/ function of R lateral and posterior shoulder stabilizers, one piece from ant deltoid to middle traps, and one middle diltoid to upper traps, with 35% pull  Instructed in therex to address the patient's stability B shoulders, utilizing rhythmic stabilization, instructed to utilize short amplitude movements, to the point of fatigue, and repeat 2-3 x a day  Iontophoresis:  patient still pt tender anterior / superior GH jt line, at attachment of long head of biceps tendon, applied ibresis, with 0/04% dexamethasome, advised to leave intact for 3 hrs post this appt for 80 MAM application  09/28/22 Therpeutic Activity: Assessed LTGs see under goals and objective info  Kinesiotaping R shoulder, 2 -I pieces, one ant upper humerus to middle traps, one lateral humerus to upper traps, 35% pull, designed to address /assist rotator cuff, post R shoulder musculature  09/25/22: Manual:  supine for alternating bouts of cervical distraction, L upper traps and L levator, B cervical occipitals , with isometrics, 5 sec bouts. 3 sets Kinesiotaping R shoulder, 2 -I pieces, one ant upper humerus to middle traps, one lateral humerus to upper traps, 35% pull, designed to address /assist rotator cuff, post R shoulder musculature  Iontophoresis, 80 mam, 0.04% dexamethasome, R shoulder over biceps long head insertion, advised to remove 3 hrs post appt.  Inst to perform L shoulder pendulum at intervals to maintain her  flexibility. 09/20/22:  Manual; Supine for stretching AAROM each shoulder, all planes.  Severely limited R shoulder especially for ER Inf/ AP glides R humeral head, gr 2 to improve jt mobility and tolerance to movement  Kinesiotaping R shoulder to support R supraspinatus, biceps, post rotator cuff musculature. 2 I pieces, distal to prox pull, from lat and ant prox humerus, to middle and upper traps   IFC: supine for IFC B upper traps and middle traps to assist with pain management  09/17/22 Manual Therapy: to decrease muscle spasm, pain and improve mobility STM and IASTM with EDGE tool to R UT, LS rhomboids, cervical paraspinals Gentle PA mobs throacic spine - not tolerated Supine cervical traction - gentle 3 x 15 sec hold   Therapeutic Exercise: to improve strength and mobility.  Demo, verbal and tactile cues throughout for technique. Attempted wall slide with wash cloth - partial range only x 3 Seated R shoulder isometrics - flex, IR, ER, ABD and ext 10 sec hold x 5 Supine scapular retraction with B shoulder ER x 5 (pt experienced back spasms after these)  Self care: discussed pros and cons of DN. Handout provided. Pt to research and possibly be willing to try.   Moist heat x 10 min to B UT and thoracic spine at end of session   09/13/22 Manual Therapy: to decrease muscle spasm, pain and improve mobility STM to R UT, LS rhomboids R scapular mobs and gentle GH joint PROM   Therapeutic Exercise: to improve strength and mobility.  Demo, verbal and tactile cues throughout for technique. Supine AAROM chest press x 10  Supine AAROM flexion x 10 to shoulder height  Moist heat x 10 min to B UT area  09/11/22 Ultrasound: x 8 min to R upper trap 1 MHz, 1.2 w/cm2 cont to decrease inflammation/pain  Manual Therapy: to decrease muscle spasm, pain and improve mobility STM to  R UT, LS   Therapeutic Exercise: to improve strength and mobility.  Demo, verbal and tactile cues throughout for  technique. Shoulder shrugs x 10  Seated R table slides flexion then across body x 10 each  IFC Estim to bil UT and cervical paraspinals x 10 min with moist heat  09/07/22 Therapeutic Exercise: to improve strength and mobility.  Demo, verbal and tactile cues throughout for technique. Isometric shoulder IR, ER - too painful, scap squeezes, very painful, table slides, too painful Therapeutic Activity:  discussing concerns, recommendations for seeing orthopedist, taking vitals, assessing symptoms, when to go to urgent care v. Er Ultrasound: x 8 min to R upper trap 1 MHz, 1.2 w/cm2 cont to decrease inflammation/pain Modalities: started to apply IFC to R shoulder when patient started to complain of dizziness and faintness, stopped, repositioned patient into reverse trendelenberg and cold compress to neck.    09/04/22 Manual Therapy: to decrease muscle spasm, pain and improve mobility STM to R UT,LS,  Therapeutic Exercise: to improve strength and mobility.  Demo, verbal and tactile cues throughout for technique.  UT and LS stretch 3x10" Wrist extension and flexion stretch 3x10" Shoulder rolls  IFC Estim to bil UT and cerival paraspinals x 10   08/31/22 Manual Therapy: to decrease muscle spasm, pain and improve mobility STM to R UT,LS, middle deltoid in sitting  Therapeutic Exercise: to improve strength and mobility.  Demo, verbal and tactile cues throughout for technique.  Seated R UT and LS stretch 2x10" R wrist ext and flex stretch 2x10" Shoulder rolls x 5 - spasm in R triceps afterward  Moist heat to R shoulder and neck x 8  min   PATIENT EDUCATION:  POC, diagnosis, and prognosis.  Pt educated via explanation, demonstration, and handout (HEP).  Pt confirms understanding verbally.   HOME EXERCISE PROGRAM: Access Code: [email or web address] URL: [email or web address] Date: 10/02/2022 Prepared by: Lempi Edwin  Exercises - Shoulder External Rotation and Scapular Retraction with  Resistance  - 1 x daily - 7 x weekly - 3 sets - 10 reps - Standing Serratus Punch with Resistance  - 1 x daily - 7 x weekly - 3 sets - 10 reps  Access Code: [email or web address] URL: [email or web address] Date: 08/31/2022 Prepared by: Verta Ellen  Exercises - Seated Upper Trapezius Stretch  - 1 x daily - 7 x weekly - 3 sets - 10 sec hold - Gentle Levator Scapulae Stretch  - 1 x daily - 7 x weekly - 3 sets - 10 sec hold - Seated Wrist Flexion Stretch  - 1 x daily - 7 x weekly - 3 sets - 10 sec hold - Seated Wrist Extension Stretch  - 1 x daily - 7 x weekly - 3 sets - 10 sec hold  ASSESSMENT:  CLINICAL IMPRESSION: Ayushi demonstrated improved active motion of her R shoulder today, also able to tolerate MMT for R shoulder ER, shoulder ext, biceps.  Noted gross weakness with R hand grasp, also pain and weakness with resisted R shoulder IR , triceps . As she has responded well to the kinesiotaping continued with this technique today.  She also still has marked tenderness R ant shoulder over long head of biceps attachment.  Added the rhythmic stab to improve her deep stability R shoulder.  Hopefully the orthopedist will assist her with progressing forward with her R shoulder.  She still is experiencing some B Sx , tightness, pain L shoulder and across thoracic spine.  She shows good improvement and continued potential for, continues  to benefit from skilled therapy. OBJECTIVE IMPAIRMENTS: Pain, shoulder ROM, shoulder strength, balance  ACTIVITY LIMITATIONS: work, housework, lifting, reaching  PERSONAL FACTORS: See medical history and pertinent history   REHAB POTENTIAL: Fair unclear etiology of balance problem  CLINICAL DECISION MAKING: Evolving/moderate complexity  EVALUATION COMPLEXITY: Moderate   GOALS:   SHORT TERM GOALS: Target date: 09/26/2022   Perry will be >75% HEP compliant to improve carryover between sessions and facilitate independent management of  condition  Evaluation: ongoing Goal status: MET- 09/28/22   LONG TERM GOALS: Target date: 10/24/2022   Larren will improve FOTO score to goal as a proxy for functional improvement  Evaluation/Baseline: TAKE VISIT 2 Goal status: INITIAL    2.  Quinton will imporve BERG balance score to 47 pts, to show a significant improvement in balance in order to reduce fall risk  Evaluation/Baseline: 40 pts Goal status: MET- 54 / 56 = 96.4 %   3.  Casia will improve 30'' STS (MCID 2) to >/= 8x (w/ UE?: Y) to show improved LE strength and improved transfers   Evaluation/Baseline: 4x  w/ UE? Y Goal status: IN PROGRESS - 7x with UE support   4.  Kynli will self report >/= 50% decrease in shoulder pain from evaluation   Evaluation/Baseline: 10/10 max pain Goal status: IN PROGRESS- 40%  5.  Damari will improve the following MMTs to >/= 4+/5 to show improvement in strength:      Goal status: IN PROGRESS - see chart    PLAN: PT FREQUENCY: 1-2x/week  PT DURATION: 8 weeks  PLANNED INTERVENTIONS: Therapeutic exercises, Aquatic therapy, Therapeutic activity, Neuro Muscular re-education, Gait training, Patient/Family education, Joint mobilization, Dry Needling, Electrical stimulation, Spinal mobilization and/or manipulation, Moist heat, Taping, Vasopneumatic device, Ionotophoresis 4mg /ml Dexamethasone, and Manual therapy  PLAN FOR NEXT SESSION:   continue with manual and pain control; gentle movements of scapula and shoulder  Chana Lindstrom L Krystyne Tewksbury, PT DPT, OCS 10/02/2022, 3:46 PM

## 2022-10-08 ENCOUNTER — Ambulatory Visit: Payer: 59 | Admitting: Physical Therapy

## 2022-10-11 ENCOUNTER — Ambulatory Visit: Payer: 59

## 2022-10-11 DIAGNOSIS — M25511 Pain in right shoulder: Secondary | ICD-10-CM

## 2022-10-11 DIAGNOSIS — R2681 Unsteadiness on feet: Secondary | ICD-10-CM

## 2022-10-11 DIAGNOSIS — M5459 Other low back pain: Secondary | ICD-10-CM

## 2022-10-11 DIAGNOSIS — M6281 Muscle weakness (generalized): Secondary | ICD-10-CM

## 2022-10-11 NOTE — Therapy (Signed)
OUTPATIENT PHYSICAL THERAPY TREATMENT  Patient Name: Donna Cowan MRN: [id number] DOB:1965/09/10, 57 y.o., female Today's Date: 10/11/2022              Past Medical History:  Diagnosis Date   Back pain    Chronic back pain    Chronic neck pain    Diabetes (HCC)    Edema of both lower extremities    Hypothyroidism    Joint pain    Lactose intolerance    Sleep apnea    Thyroid disease    Past Surgical History:  Procedure Laterality Date   THYROID SURGERY     TUBAL LIGATION     Patient Active Problem List   Diagnosis Date Noted   Anger reaction 05/09/2021   Family discord 05/09/2021   PTSD (post-traumatic stress disorder) 05/09/2021   Chronic right-sided headache 07/19/2020   Right sided temporal headache 07/14/2020   Carpal tunnel syndrome 06/20/2020   Leg swelling 06/20/2020   Ankle strain 06/20/2020   Tinnitus of right ear 05/19/2020   Class 1 obesity due to excess calories with body mass index (BMI) of 31.0 to 31.9 in adult 08/12/2019   Bereavement reaction 03/29/2019   Type 2 diabetes mellitus without complications (HCC) 03/29/2019   Graves disease 03/29/2019   Healthcare maintenance 03/29/2019   Vitamin D deficiency 03/29/2019   Back injury, sequela 03/29/2019    PCP: Leilani Able, MD  REFERRING PROVIDER: Glendale Chard, DO  THERAPY DIAG:  Right shoulder pain, unspecified chronicity  Unsteadiness on feet  Muscle weakness  Other low back pain  REFERRING DIAG: Balance problem [R26.89], Falls frequently [R29.6], Right arm pain [M79.601]   Rationale for Evaluation and Treatment:  Rehabilitation  SUBJECTIVE:  PERTINENT PAST HISTORY:  TII DM, graves disease, back pain, carpal tunnel        PRECAUTIONS: Fall  WEIGHT BEARING RESTRICTIONS No  FALLS:  Has patient fallen in last 6 months? Yes, Number of falls: 4x - started with a fall down steps (unsure of cause), fell into truck and hurt R arm, fell into wall, fell out of  chair  MOI/History of condition:  Onset date: May 18  SUBJECTIVE STATEMENT Patient states that she got an xray from doctors office, he offered cortizone injection but she felt she did not need it. Per patient he seems to think her neck is the issue.  From referring provider: "Falls, unclear etiology. There is no evidence of myelopathy, neuropathy, or myopathy. She retains consciousness making seizures unlikely. Her exam shows mild right arm and leg weakness with normal reflexes and sensation. To further evaluation, I will order MRI brain wo contrast. Going forward, consider MRI lumbar spine."    Red flags:  Unexplained balance deficits  Pain:  Are you having pain? 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 No acute fracture or traumatic listhesis of the cervical spine. 2. Moderate multilevel degenerative disc disease, most pronounced at C5-6.  GENERAL OBSERVATION/GAIT:  Unsteady gait, forward trunk lean  SENSATION:  Light touch: Appears intact with exception of digits 4 and 5  R hand  Evaluation/Baseline:  UPPER EXTREMITY MMT:  MMT Right 08/29/2022 Left 08/29/2022  Shoulder flexion P! n  Shoulder abduction (C5) P! n  Shoulder ER 3+* n  Shoulder IR 3+* n  Middle trapezius    Lower trapezius    Shoulder extension    Grip strength    Shoulder shrug (C4)    Elbow flexion (C6)    Elbow ext (C7)    Thumb ext (C8)    Finger abd (T1)    Grossly     (Blank rows = not tested, score listed is out of 5  possible points.  N = WNL, D = diminished, C = clear for gross weakness with myotome testing, * = concordant pain with testing)  UPPER EXTREMITY A/P ROM:  ROM Right (Eval) Left (Eval) Right AROM 10/02/22  Shoulder flexion 90*/110* 130 90  Shoulder abduction 80*/100* 120 90  Shoulder internal rotation   Hand to R PSIS  Shoulder external rotation   80  Functional IR R hip* L3   Functional ER R ear* C7 T1  Shoulder extension     Elbow extension     Elbow flexion      (Blank rows = not tested, N = WNL, * = concordant pain with testing)     LE MMT:  MMT Right (Eval) Left (Eval)  Hip flexion (L2, L3) c c  Knee extension (L3) c c  Knee flexion    Hip abduction    Hip extension    Hip external rotation    Hip internal rotation    Hip adduction    Ankle dorsiflexion (L4) c c  Ankle plantarflexion (S1) c c  Ankle inversion    Ankle eversion    Great Toe ext (L5) c c  Grossly     (Blank rows = not tested, score listed is out of 5 possible points.  N = WNL, D = diminished, C = clear for gross weakness with myotome testing, * = concordant pain with testing)   UPPER EXTREMITY MMT:  MMT Right 08/29/2022 Left 08/29/2022 Right 09/28/22  Shoulder flexion P! n 4  Shoulder abduction (C5) P! n 4- p!  Shoulder ER 3+* n 4- p!  Shoulder IR 3+* n 4+  Middle trapezius     Lower trapezius     Shoulder extension     Grip strength     Shoulder shrug (C4)     Elbow flexion (C6)     Elbow ext (C7)     Thumb ext (C8)     Finger abd (T1)     Grossly      (Blank rows = not tested, score listed is out of 5 possible points.  N = WNL, D = diminished, C = clear for gross weakness with myotome testing, * = concordant pain with testing)   Functional Tests   Berg 40/56   PALPATION:   TTP R UT, Infra and supraspinatus muscle belly   R/C test cluster (+)  SPECIAL TESTS:  Spurling's (-) for UE sxs  PATIENT SURVEYS:  FOTO:   TODAY'S TREATMENT  10/11/22 UBE x 2 min fwd/x 3 min  back Attempted lat pulls but pain going back up Rows 10# 2x5 B B ER and scap retraction YTB x 10  Chest press YTB x 10 Grip squeezes R hand x 10 with hand towel   Manual Therapy: Gentle STM to R wrist flexors   10/02/22: Therapeutic exercise:  Kinesiotaping:  Applied 2 -I pieces of  kinesiotape to enhance centralization of humeral head and assist with stability/ function of R lateral and posterior shoulder stabilizers, one piece from ant deltoid to middle traps, and one middle diltoid to upper traps, with 35% pull  Instructed in therex to address the patient's stability B shoulders, utilizing rhythmic stabilization, instructed to utilize short amplitude movements, to the point of fatigue, and repeat 2-3 x a day  Iontophoresis:  patient still pt tender anterior / superior GH jt line, at attachment of long head of biceps tendon, applied ibresis, with 0/04% dexamethasome, advised to leave intact for 3 hrs post this appt for 80 MAM application  09/28/22 Therpeutic Activity: Assessed LTGs see under goals and objective info  Kinesiotaping R shoulder, 2 -I pieces, one ant upper humerus to middle traps, one lateral humerus to upper traps, 35% pull, designed to address /assist rotator cuff, post R shoulder musculature  09/25/22: Manual:  supine for alternating bouts of cervical distraction, L upper traps and L levator, B cervical occipitals , with isometrics, 5 sec bouts. 3 sets Kinesiotaping R shoulder, 2 -I pieces, one ant upper humerus to middle traps, one lateral humerus to upper traps, 35% pull, designed to address /assist rotator cuff, post R shoulder musculature  Iontophoresis, 80 mam, 0.04% dexamethasome, R shoulder over biceps long head insertion, advised to remove 3 hrs post appt.  Inst to perform L shoulder pendulum at intervals to maintain her flexibility. 09/20/22:  Manual; Supine for stretching AAROM each shoulder, all planes.  Severely limited R shoulder especially for ER Inf/ AP  glides R humeral head, gr 2 to improve jt mobility and tolerance to movement  Kinesiotaping R shoulder to support R supraspinatus, biceps, post rotator cuff musculature. 2 I pieces, distal to prox pull, from lat and ant prox humerus, to middle and upper traps   IFC: supine for IFC B upper traps and middle traps to assist with pain management  09/17/22 Manual Therapy: to decrease muscle spasm, pain and improve mobility STM and IASTM with EDGE tool to R UT, LS rhomboids, cervical paraspinals Gentle PA mobs throacic spine - not tolerated Supine cervical traction - gentle 3 x 15 sec hold   Therapeutic Exercise: to improve strength and mobility.  Demo, verbal and tactile cues throughout for technique. Attempted wall slide with wash cloth - partial range only x 3 Seated R shoulder isometrics - flex, IR, ER, ABD and ext 10 sec hold x 5 Supine scapular retraction with B shoulder ER x 5 (pt experienced back spasms after these)  Self care: discussed pros and cons of DN. Handout provided. Pt to research and possibly be willing to try.   Moist heat x 10 min to B UT and thoracic spine at end of session   09/13/22 Manual Therapy: to decrease muscle spasm, pain and improve mobility STM to R UT, LS rhomboids R scapular mobs and gentle GH joint PROM   Therapeutic Exercise: to improve strength and mobility.  Demo, verbal and tactile cues throughout for technique. Supine AAROM chest press x 10  Supine AAROM flexion x 10 to shoulder height  Moist heat x 10 min to B UT area  09/11/22 Ultrasound: x 8 min to R upper trap 1 MHz, 1.2 w/cm2 cont to decrease inflammation/pain  Manual Therapy: to decrease muscle spasm, pain and improve mobility STM to R UT, LS   Therapeutic Exercise: to improve strength and mobility.  Demo, verbal and tactile cues throughout for technique. Shoulder shrugs x 10  Seated R table slides  flexion then across body x 10 each  IFC Estim to bil UT and cervical paraspinals x 10 min  with moist heat  09/07/22 Therapeutic Exercise: to improve strength and mobility.  Demo, verbal and tactile cues throughout for technique. Isometric shoulder IR, ER - too painful, scap squeezes, very painful, table slides, too painful Therapeutic Activity:  discussing concerns, recommendations for seeing orthopedist, taking vitals, assessing symptoms, when to go to urgent care v. Er Ultrasound: x 8 min to R upper trap 1 MHz, 1.2 w/cm2 cont to decrease inflammation/pain Modalities: started to apply IFC to R shoulder when patient started to complain of dizziness and faintness, stopped, repositioned patient into reverse trendelenberg and cold compress to neck.    09/04/22 Manual Therapy: to decrease muscle spasm, pain and improve mobility STM to R UT,LS,  Therapeutic Exercise: to improve strength and mobility.  Demo, verbal and tactile cues throughout for technique.  UT and LS stretch 3x10" Wrist extension and flexion stretch 3x10" Shoulder rolls  IFC Estim to bil UT and cerival paraspinals x 10   08/31/22 Manual Therapy: to decrease muscle spasm, pain and improve mobility STM to R UT,LS, middle deltoid in sitting  Therapeutic Exercise: to improve strength and mobility.  Demo, verbal and tactile cues throughout for technique.  Seated R UT and LS stretch 2x10" R wrist ext and flex stretch 2x10" Shoulder rolls x 5 - spasm in R triceps afterward  Moist heat to R shoulder and neck x 8  min   PATIENT EDUCATION:  POC, diagnosis, and prognosis.  Pt educated via explanation, demonstration, and handout (HEP).  Pt confirms understanding verbally.   HOME EXERCISE PROGRAM: Access Code: [email or web address] URL: [email or web address] Date: 10/02/2022 Prepared by: Amy Speaks  Exercises - Shoulder External Rotation and Scapular Retraction with Resistance  - 1 x daily - 7 x weekly - 3 sets - 10 reps - Standing Serratus Punch with Resistance  - 1 x daily - 7 x weekly - 3 sets - 10 reps  Access  Code: [email or web address] URL: [email or web address] Date: 08/31/2022 Prepared by: Verta Ellen  Exercises - Seated Upper Trapezius Stretch  - 1 x daily - 7 x weekly - 3 sets - 10 sec hold - Gentle Levator Scapulae Stretch  - 1 x daily - 7 x weekly - 3 sets - 10 sec hold - Seated Wrist Flexion Stretch  - 1 x daily - 7 x weekly - 3 sets - 10 sec hold - Seated Wrist Extension Stretch  - 1 x daily - 7 x weekly - 3 sets - 10 sec hold  ASSESSMENT:  CLINICAL IMPRESSION: Pt able to complete all interventions today. Pain on the eccentric phase of lat pulls so discontinued. Still slowly progressing with exercises but better tolerance shown today. Very tender over the wrist flexors with MT. Needs more work on getting her upper body moving more. OBJECTIVE IMPAIRMENTS: Pain, shoulder ROM, shoulder strength, balance  ACTIVITY LIMITATIONS: work, housework, lifting, reaching  PERSONAL FACTORS: See medical history and pertinent history   REHAB POTENTIAL: Fair unclear etiology of balance problem  CLINICAL DECISION MAKING: Evolving/moderate complexity  EVALUATION COMPLEXITY: Moderate   GOALS:   SHORT TERM GOALS: Target date: 09/26/2022   Donna Cowan will be >75% HEP compliant to improve carryover between sessions and facilitate independent management of condition  Evaluation: ongoing Goal status: MET- 09/28/22   LONG TERM GOALS: Target date: 10/24/2022   Donna Cowan will improve FOTO score to goal as a proxy for functional improvement  Evaluation/Baseline: TAKE VISIT 2  Goal status: INITIAL    2.  Donna Cowan will imporve BERG balance score to 47 pts, to show a significant improvement in balance in order to reduce fall risk  Evaluation/Baseline: 40 pts Goal status: MET- 54 / 56 = 96.4 %   3.  Donna Cowan will improve 30'' STS (MCID 2) to >/= 8x (w/ UE?: Y) to show improved LE strength and improved transfers   Evaluation/Baseline: 4x  w/ UE? Y Goal status: IN PROGRESS - 7x with UE  support   4.  Donna Cowan will self report >/= 50% decrease in shoulder pain from evaluation   Evaluation/Baseline: 10/10 max pain Goal status: IN PROGRESS- 40%  5.  Donna Cowan will improve the following MMTs to >/= 4+/5 to show improvement in strength:      Goal status: IN PROGRESS - see chart    PLAN: PT FREQUENCY: 1-2x/week  PT DURATION: 8 weeks  PLANNED INTERVENTIONS: Therapeutic exercises, Aquatic therapy, Therapeutic activity, Neuro Muscular re-education, Gait training, Patient/Family education, Joint mobilization, Dry Needling, Electrical stimulation, Spinal mobilization and/or manipulation, Moist heat, Taping, Vasopneumatic device, Ionotophoresis 4mg /ml Dexamethasone, and Manual therapy  PLAN FOR NEXT SESSION:   continue with manual and pain control; gentle movements of scapula and shoulder  Sherl Yzaguirre L Daliya Parchment, PTA 10/11/2022, 11:09 AM

## 2022-10-17 ENCOUNTER — Encounter: Payer: 59 | Admitting: Physical Therapy

## 2022-10-18 ENCOUNTER — Ambulatory Visit: Payer: 59 | Attending: Neurology

## 2022-10-18 DIAGNOSIS — R2681 Unsteadiness on feet: Secondary | ICD-10-CM | POA: Insufficient documentation

## 2022-10-18 DIAGNOSIS — M6281 Muscle weakness (generalized): Secondary | ICD-10-CM | POA: Diagnosis present

## 2022-10-18 DIAGNOSIS — M5459 Other low back pain: Secondary | ICD-10-CM | POA: Insufficient documentation

## 2022-10-18 DIAGNOSIS — M25511 Pain in right shoulder: Secondary | ICD-10-CM | POA: Diagnosis present

## 2022-10-18 NOTE — Therapy (Signed)
OUTPATIENT PHYSICAL THERAPY TREATMENT  Patient Name: Donna Cowan MRN: [id number] DOB:May 05, 1965, 57 y.o., female Today's Date: 10/18/2022   PT End of Session - 10/18/22 1755     Visit Number 13    Date for PT Re-Evaluation 10/24/22    Authorization Type [employer]    PT Start Time 1705    PT Stop Time 1804    PT Time Calculation (min) 59 min    Activity Tolerance Patient tolerated treatment well    Behavior During Therapy WFL for tasks assessed/performed                       Past Medical History:  Diagnosis Date   Back pain    Chronic back pain    Chronic neck pain    Diabetes (HCC)    Edema of both lower extremities    Hypothyroidism    Joint pain    Lactose intolerance    Sleep apnea    Thyroid disease    Past Surgical History:  Procedure Laterality Date   THYROID SURGERY     TUBAL LIGATION     Patient Active Problem List   Diagnosis Date Noted   Anger reaction 05/09/2021   Family discord 05/09/2021   PTSD (post-traumatic stress disorder) 05/09/2021   Chronic right-sided headache 07/19/2020   Right sided temporal headache 07/14/2020   Carpal tunnel syndrome 06/20/2020   Leg swelling 06/20/2020   Ankle strain 06/20/2020   Tinnitus of right ear 05/19/2020   Class 1 obesity due to excess calories with body mass index (BMI) of 31.0 to 31.9 in adult 08/12/2019   Bereavement reaction 03/29/2019   Type 2 diabetes mellitus without complications (HCC) 03/29/2019   Graves disease 03/29/2019   Healthcare maintenance 03/29/2019   Vitamin D deficiency 03/29/2019   Back injury, sequela 03/29/2019    PCP: Leilani Able, MD  REFERRING PROVIDER: Glendale Chard, DO  THERAPY DIAG:  Right shoulder pain, unspecified chronicity  Unsteadiness on feet  Muscle weakness  Other low back pain  REFERRING DIAG: Balance problem [R26.89], Falls frequently [R29.6], Right arm pain [M79.601]   Rationale for Evaluation and Treatment:   Rehabilitation  SUBJECTIVE:  PERTINENT PAST HISTORY:  TII DM, graves disease, back pain, carpal tunnel        PRECAUTIONS: Fall  WEIGHT BEARING RESTRICTIONS No  FALLS:  Has patient fallen in last 6 months? Yes, Number of falls: 4x - started with a fall down steps (unsure of cause), fell into truck and hurt R arm, fell into wall, fell out of chair  MOI/History of condition:  Onset date: May 18  SUBJECTIVE STATEMENT Patient reports returning to work this week, yesterday was bad as she was typing only with index finger at one point.   From referring provider: "Falls, unclear etiology. There is no evidence of myelopathy, neuropathy, or myopathy. She retains consciousness making seizures unlikely. Her exam shows mild right arm and leg weakness with normal reflexes and sensation. To further evaluation, I will order MRI brain wo contrast. Going forward, consider MRI lumbar spine."    Red flags:  Unexplained balance deficits  Pain:  Are you having pain? 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 No acute fracture or traumatic listhesis of the cervical spine. 2. Moderate multilevel degenerative disc disease, most pronounced at C5-6.  GENERAL OBSERVATION/GAIT:  Unsteady gait, forward trunk lean  SENSATION:  Light touch: Appears intact with exception of digits 4 and 5 R hand  Evaluation/Baseline:  UPPER EXTREMITY MMT:  MMT Right 08/29/2022 Left 08/29/2022  Shoulder flexion P! n  Shoulder abduction (C5) P! n   Shoulder ER 3+* n  Shoulder IR 3+* n  Middle trapezius    Lower trapezius    Shoulder extension    Grip strength    Shoulder shrug (C4)    Elbow flexion (C6)    Elbow ext (C7)    Thumb ext (C8)    Finger abd (T1)    Grossly     (Blank rows = not tested, score listed is out of 5 possible points.  N = WNL, D = diminished, C = clear for gross weakness with myotome testing, * = concordant pain with testing)  UPPER EXTREMITY A/P ROM:  ROM Right (Eval) Left (Eval) Right AROM 10/02/22  Shoulder flexion 90*/110* 130 90  Shoulder abduction 80*/100* 120 90  Shoulder internal rotation   Hand to R PSIS  Shoulder external rotation   80  Functional IR R hip* L3   Functional ER R ear* C7 T1  Shoulder extension     Elbow extension     Elbow flexion      (Blank rows = not tested, N = WNL, * = concordant pain with testing)     LE MMT:  MMT Right (Eval) Left (Eval)  Hip flexion (L2, L3) c c  Knee extension (L3) c c  Knee flexion    Hip abduction    Hip extension    Hip external rotation    Hip internal rotation    Hip adduction    Ankle dorsiflexion (L4) c c  Ankle plantarflexion (S1) c c  Ankle inversion    Ankle eversion    Great Toe ext (L5) c c  Grossly     (Blank rows = not tested, score listed is out of 5 possible points.  N = WNL, D = diminished, C = clear for gross weakness with myotome testing, * = concordant pain with testing)   UPPER EXTREMITY MMT:  MMT Right 08/29/2022 Left 08/29/2022 Right 09/28/22  Shoulder flexion P! n 4  Shoulder abduction (C5) P! n 4- p!  Shoulder ER 3+* n 4- p!  Shoulder IR 3+* n 4+  Middle trapezius     Lower trapezius     Shoulder extension     Grip strength     Shoulder shrug (C4)     Elbow flexion (C6)     Elbow ext (C7)     Thumb ext (C8)     Finger abd (T1)     Grossly      (Blank rows = not tested, score listed is out of 5 possible points.  N = WNL, D = diminished, C = clear for gross weakness with myotome testing, *  = concordant pain with testing)   Functional Tests   Berg 40/56   PALPATION:   TTP R UT, Infra and supraspinatus muscle belly   R/C test cluster (+)  SPECIAL TESTS:  Spurling's (-) for UE sxs  PATIENT SURVEYS:  FOTO:  TODAY'S TREATMENT  10/18/22 Manual Therapy: to decrease muscle spasm, pain and improve mobility.  STM to R UT,LS, rhomboids  Therapeutic Exercise: to improve strength and mobility.  Demo, verbal and tactile cues throughout for technique.  Thoracic extension in chair x10 Mid row with scap retraction seated x 10 Seated rows 10# x15  IFC: supine for IFC B upper traps and middle traps with heat to assist with pain management x 12 min  10/11/22 UBE x 2 min fwd/x 3 min back Attempted lat pulls but pain going back up Rows 10# 2x5 B B ER and scap retraction YTB x 10  Chest press YTB x 10 Grip squeezes R hand x 10 with hand towel   Manual Therapy: Gentle STM to R wrist flexors   10/02/22: Therapeutic exercise:  Kinesiotaping:  Applied 2 -I pieces of kinesiotape to enhance centralization of humeral head and assist with stability/ function of R lateral and posterior shoulder stabilizers, one piece from ant deltoid to middle traps, and one middle diltoid to upper traps, with 35% pull  Instructed in therex to address the patient's stability B shoulders, utilizing rhythmic stabilization, instructed to utilize short amplitude movements, to the point of fatigue, and repeat 2-3 x a day  Iontophoresis:  patient still pt tender anterior / superior GH jt line, at attachment of long head of biceps tendon, applied ibresis, with 0/04% dexamethasome, advised to leave intact for 3 hrs post this appt for 80 MAM application  09/28/22 Therpeutic Activity: Assessed LTGs see under goals and objective info  Kinesiotaping R shoulder, 2 -I pieces, one ant upper humerus to middle traps, one lateral humerus to upper traps, 35% pull, designed to address /assist rotator cuff, post R  shoulder musculature  09/25/22: Manual:  supine for alternating bouts of cervical distraction, L upper traps and L levator, B cervical occipitals , with isometrics, 5 sec bouts. 3 sets Kinesiotaping R shoulder, 2 -I pieces, one ant upper humerus to middle traps, one lateral humerus to upper traps, 35% pull, designed to address /assist rotator cuff, post R shoulder musculature  Iontophoresis, 80 mam, 0.04% dexamethasome, R shoulder over biceps long head insertion, advised to remove 3 hrs post appt.  Inst to perform L shoulder pendulum at intervals to maintain her flexibility. 09/20/22:  Manual; Supine for stretching AAROM each shoulder, all planes.  Severely limited R shoulder especially for ER Inf/ AP glides R humeral head, gr 2 to improve jt mobility and tolerance to movement  Kinesiotaping R shoulder to support R supraspinatus, biceps, post rotator cuff musculature. 2 I pieces, distal to prox pull, from lat and ant prox humerus, to middle and upper traps   IFC: supine for IFC B upper traps and middle traps to assist with pain management  09/17/22 Manual Therapy: to decrease muscle spasm, pain and improve mobility STM and IASTM with EDGE tool to R UT, LS rhomboids, cervical paraspinals Gentle PA mobs throacic spine - not tolerated Supine cervical traction - gentle 3 x 15 sec hold   Therapeutic Exercise: to improve strength and mobility.  Demo, verbal and tactile cues throughout for technique. Attempted wall slide with wash cloth - partial range only x 3 Seated R shoulder isometrics - flex, IR, ER, ABD and ext 10 sec hold x 5 Supine scapular retraction with B shoulder ER x 5 (pt experienced back spasms after these)  Self care: discussed pros and cons of DN. Handout provided. Pt to research and possibly be willing to try.   Moist heat  x 10 min to B UT and thoracic spine at end of session   09/13/22 Manual Therapy: to decrease muscle spasm, pain and improve mobility STM to R UT, LS  rhomboids R scapular mobs and gentle GH joint PROM   Therapeutic Exercise: to improve strength and mobility.  Demo, verbal and tactile cues throughout for technique. Supine AAROM chest press x 10  Supine AAROM flexion x 10 to shoulder height  Moist heat x 10 min to B UT area  09/11/22 Ultrasound: x 8 min to R upper trap 1 MHz, 1.2 w/cm2 cont to decrease inflammation/pain  Manual Therapy: to decrease muscle spasm, pain and improve mobility STM to R UT, LS   Therapeutic Exercise: to improve strength and mobility.  Demo, verbal and tactile cues throughout for technique. Shoulder shrugs x 10  Seated R table slides flexion then across body x 10 each  IFC Estim to bil UT and cervical paraspinals x 10 min with moist heat  09/07/22 Therapeutic Exercise: to improve strength and mobility.  Demo, verbal and tactile cues throughout for technique. Isometric shoulder IR, ER - too painful, scap squeezes, very painful, table slides, too painful Therapeutic Activity:  discussing concerns, recommendations for seeing orthopedist, taking vitals, assessing symptoms, when to go to urgent care v. Er Ultrasound: x 8 min to R upper trap 1 MHz, 1.2 w/cm2 cont to decrease inflammation/pain Modalities: started to apply IFC to R shoulder when patient started to complain of dizziness and faintness, stopped, repositioned patient into reverse trendelenberg and cold compress to neck.    09/04/22 Manual Therapy: to decrease muscle spasm, pain and improve mobility STM to R UT,LS,  Therapeutic Exercise: to improve strength and mobility.  Demo, verbal and tactile cues throughout for technique.  UT and LS stretch 3x10" Wrist extension and flexion stretch 3x10" Shoulder rolls  IFC Estim to bil UT and cerival paraspinals x 10   08/31/22 Manual Therapy: to decrease muscle spasm, pain and improve mobility STM to R UT,LS, middle deltoid in sitting  Therapeutic Exercise: to improve strength and mobility.  Demo, verbal and  tactile cues throughout for technique.  Seated R UT and LS stretch 2x10" R wrist ext and flex stretch 2x10" Shoulder rolls x 5 - spasm in R triceps afterward  Moist heat to R shoulder and neck x 8  min   PATIENT EDUCATION:  POC, diagnosis, and prognosis.  Pt educated via explanation, demonstration, and handout (HEP).  Pt confirms understanding verbally.   HOME EXERCISE PROGRAM: Access Code: [email or web address] URL: [email or web address] Date: 10/02/2022 Prepared by: Amy Speaks  Exercises - Shoulder External Rotation and Scapular Retraction with Resistance  - 1 x daily - 7 x weekly - 3 sets - 10 reps - Standing Serratus Punch with Resistance  - 1 x daily - 7 x weekly - 3 sets - 10 reps  Access Code: [email or web address] URL: [email or web address] Date: 08/31/2022 Prepared by: Verta Ellen  Exercises - Seated Upper Trapezius Stretch  - 1 x daily - 7 x weekly - 3 sets - 10 sec hold - Gentle Levator Scapulae Stretch  - 1 x daily - 7 x weekly - 3 sets - 10 sec hold - Seated Wrist Flexion Stretch  - 1 x daily - 7 x weekly - 3 sets - 10 sec hold - Seated Wrist Extension Stretch  - 1 x daily - 7 x weekly - 3 sets - 10 sec hold  ASSESSMENT:  CLINICAL IMPRESSION: Pt arived flared up today as she returned to work  this week with part time hours. She continues to have numbness in ring and pinky fingers of R hand. She is still tense in the UT,LS, and rhomboids but less muscle tension was noted after manual. She had limited tolerance for exercises today as well. Concluded visit with estim and moist heat for pain control. OBJECTIVE IMPAIRMENTS: Pain, shoulder ROM, shoulder strength, balance  ACTIVITY LIMITATIONS: work, housework, lifting, reaching  PERSONAL FACTORS: See medical history and pertinent history   REHAB POTENTIAL: Fair unclear etiology of balance problem  CLINICAL DECISION MAKING: Evolving/moderate complexity  EVALUATION COMPLEXITY: Moderate   GOALS:   SHORT  TERM GOALS: Target date: 09/26/2022   Becki will be >75% HEP compliant to improve carryover between sessions and facilitate independent management of condition  Evaluation: ongoing Goal status: MET- 09/28/22   LONG TERM GOALS: Target date: 10/24/2022   Vieva will improve FOTO score to goal as a proxy for functional improvement  Evaluation/Baseline: TAKE VISIT 2 Goal status: INITIAL    2.  Kahealani will imporve BERG balance score to 47 pts, to show a significant improvement in balance in order to reduce fall risk  Evaluation/Baseline: 40 pts Goal status: MET- 54 / 56 = 96.4 %   3.  Jestiny will improve 30'' STS (MCID 2) to >/= 8x (w/ UE?: Y) to show improved LE strength and improved transfers   Evaluation/Baseline: 4x  w/ UE? Y Goal status: IN PROGRESS - 7x with UE support   4.  Maame will self report >/= 50% decrease in shoulder pain from evaluation   Evaluation/Baseline: 10/10 max pain Goal status: IN PROGRESS- 40%  5.  Shenicka will improve the following MMTs to >/= 4+/5 to show improvement in strength:      Goal status: IN PROGRESS - see chart    PLAN: PT FREQUENCY: 1-2x/week  PT DURATION: 8 weeks  PLANNED INTERVENTIONS: Therapeutic exercises, Aquatic therapy, Therapeutic activity, Neuro Muscular re-education, Gait training, Patient/Family education, Joint mobilization, Dry Needling, Electrical stimulation, Spinal mobilization and/or manipulation, Moist heat, Taping, Vasopneumatic device, Ionotophoresis 4mg /ml Dexamethasone, and Manual therapy  PLAN FOR NEXT SESSION:   continue with manual and pain control; gentle movements of scapula and shoulder  Darleene Cleaver, PTA 10/18/2022, 6:21 PM

## 2022-10-23 ENCOUNTER — Ambulatory Visit: Payer: 59 | Admitting: Physical Therapy

## 2022-10-23 ENCOUNTER — Encounter: Payer: Self-pay | Admitting: Physical Therapy

## 2022-10-23 DIAGNOSIS — M25511 Pain in right shoulder: Secondary | ICD-10-CM

## 2022-10-23 DIAGNOSIS — M6281 Muscle weakness (generalized): Secondary | ICD-10-CM

## 2022-10-23 DIAGNOSIS — M5459 Other low back pain: Secondary | ICD-10-CM

## 2022-10-23 DIAGNOSIS — R2681 Unsteadiness on feet: Secondary | ICD-10-CM

## 2022-10-23 NOTE — Therapy (Addendum)
OUTPATIENT PHYSICAL THERAPY TREATMENT/Discharge Summary Progress Note Reporting Period 08/29/2022 to 10/23/2022  See note below for Objective Data and Assessment of Progress/Goals.     Patient Name: Donna Cowan MRN: [id number] DOB:[birth year], 57 y.o., female Today's Date: 10/23/2022   PT End of Session - 10/23/22 1616     Visit Number 14    Date for PT Re-Evaluation 10/24/22    Authorization Type [employer]    PT Start Time 1616    PT Stop Time 1705    PT Time Calculation (min) 49 min    Activity Tolerance Patient tolerated treatment well;Patient limited by pain    Behavior During Therapy Agitated;WFL for tasks assessed/performed                       Past Medical History:  Diagnosis Date   Back pain    Chronic back pain    Chronic neck pain    Diabetes (HCC)    Edema of both lower extremities    Hypothyroidism    Joint pain    Lactose intolerance    Sleep apnea    Thyroid disease    Past Surgical History:  Procedure Laterality Date   THYROID SURGERY     TUBAL LIGATION     Patient Active Problem List   Diagnosis Date Noted   Anger reaction 05/09/2021   Family discord 05/09/2021   PTSD (post-traumatic stress disorder) 05/09/2021   Chronic right-sided headache 07/19/2020   Right sided temporal headache 07/14/2020   Carpal tunnel syndrome 06/20/2020   Leg swelling 06/20/2020   Ankle strain 06/20/2020   Tinnitus of right ear 05/19/2020   Class 1 obesity due to excess calories with body mass index (BMI) of 31.0 to 31.9 in adult 08/12/2019   Bereavement reaction 03/29/2019   Type 2 diabetes mellitus without complications (HCC) 03/29/2019   Graves disease 03/29/2019   Healthcare maintenance 03/29/2019   Vitamin D deficiency 03/29/2019   Back injury, sequela 03/29/2019    PCP: Leilani Able, MD  REFERRING PROVIDER: Glendale Chard, DO  THERAPY DIAG:  Right shoulder pain, unspecified chronicity  Unsteadiness on feet  Muscle weakness  Other low  back pain  REFERRING DIAG: Balance problem [R26.89], Falls frequently [R29.6], Right arm pain [M79.601]   Rationale for Evaluation and Treatment:  Rehabilitation  SUBJECTIVE:  PERTINENT PAST HISTORY:  TII DM, graves disease, back pain, carpal tunnel        PRECAUTIONS: Fall  WEIGHT BEARING RESTRICTIONS No  FALLS:  Has patient fallen in last 6 months? Yes, Number of falls: 4x - started with a fall down steps (unsure of cause), fell into truck and hurt R arm, fell into wall, fell out of chair  MOI/History of condition:  Onset date: May 18  SUBJECTIVE STATEMENT Neck and R shoulder pain are still bad, balance and LE strength are fine.  Pt. Reports no falls since June.  She reports having a lot of knots in her neck, wanted her husband to come to learn how to rub them like we do, but he wouldn't come.  She had a bad experience with the orthopedist, he told her there was nothing wrong with her shoulder based on x-ray but that he would give her a cortisone shot since that was what she wanted, she was so upset by his attitude that she wanted to cancel all her doctor and therapy visits.    From referring provider: "Falls, unclear etiology. There is no evidence of myelopathy, neuropathy, or  myopathy. She retains consciousness making seizures unlikely. Her exam shows mild right arm and leg weakness with normal reflexes and sensation. To further evaluation, I will order MRI brain wo contrast. Going forward, consider MRI lumbar spine."    Red flags:  Unexplained balance deficits  Pain:  Are you having pain? 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 No acute fracture or traumatic listhesis of the cervical spine. 2. Moderate multilevel degenerative disc disease, most pronounced at C5-6.  GENERAL OBSERVATION/GAIT:  Unsteady gait, forward trunk lean  SENSATION:  Light touch: Appears intact with exception of digits 4 and 5 R hand  Evaluation/Baseline:  UPPER EXTREMITY MMT:  MMT Right 08/29/2022 Left 08/29/2022  Shoulder flexion P! n  Shoulder abduction (C5) P! n  Shoulder ER 3+* n  Shoulder IR 3+* n  Middle trapezius    Lower trapezius    Shoulder extension    Grip strength    Shoulder shrug (C4)    Elbow flexion (C6)    Elbow ext (C7)    Thumb ext (C8)    Finger abd (T1)    Grossly     (Blank rows = not tested, score listed is out of 5 possible points.  N = WNL, D = diminished, C = clear for gross weakness with myotome testing, * = concordant pain with testing)  UPPER EXTREMITY A/P ROM:  ROM Right (Eval) Left (Eval) Right AROM 10/02/22  Shoulder flexion 90*/110* 130 90  Shoulder abduction 80*/100* 120 90  Shoulder internal rotation   Hand to R PSIS  Shoulder external rotation   80  Functional IR R hip* L3   Functional ER R ear* C7 T1  Shoulder extension     Elbow extension     Elbow flexion      (Blank rows = not tested, N = WNL, * = concordant pain with testing)     LE MMT:  MMT Right (Eval) Left (Eval)  Hip flexion (L2, L3) c c  Knee extension (L3) c c  Knee flexion    Hip abduction    Hip extension    Hip external rotation    Hip internal rotation    Hip adduction    Ankle dorsiflexion (L4) c c  Ankle plantarflexion (S1) c c  Ankle inversion    Ankle eversion    Great Toe ext (L5) c c  Grossly     (Blank rows = not tested, score listed is out of 5 possible points.  N = WNL, D = diminished, C = clear for gross weakness with myotome testing, * = concordant pain with  testing)   UPPER EXTREMITY MMT:  MMT Right 08/29/2022 Left 08/29/2022 Right 09/28/22  Shoulder flexion P! n 4  Shoulder abduction (C5) P! n 4- p!  Shoulder ER 3+* n 4- p!  Shoulder IR 3+* n 4+  Middle trapezius     Lower trapezius     Shoulder extension     Grip strength     Shoulder shrug (C4)     Elbow flexion (  C6)     Elbow ext (C7)     Thumb ext (C8)     Finger abd (T1)     Grossly      (Blank rows = not tested, score listed is out of 5 possible points.  N = WNL, D = diminished, C = clear for gross weakness with myotome testing, * = concordant pain with testing)   Functional Tests   Berg 40/56   PALPATION:   TTP R UT, Infra and supraspinatus muscle belly   R/C test cluster (+)  SPECIAL TESTS:  Spurling's (-) for UE sxs  PATIENT SURVEYS:  FOTO:   TODAY'S TREATMENT   10/23/22 Manual Cervical traction x 10 min at 12# pull.   Manual Therapy: to decrease muscle spasm and pain and improve mobility STM/TPR to bil cervical paraspinals and UT; Kinesiotaping:  Applied 2 -I pieces of kinesiotape to enhance centralization of humeral head and assist with stability/ function of R lateral and posterior shoulder stabilizers, one piece from ant deltoid to middle traps, and one middle deltoid to upper traps, with 35% pull Therapeutic Activity:  assessment of progress towards goals, next steps, pain management.   10/18/22 Manual Therapy: to decrease muscle spasm, pain and improve mobility.  STM to R UT,LS, rhomboids  Therapeutic Exercise: to improve strength and mobility.  Demo, verbal and tactile cues throughout for technique.  Thoracic extension in chair x10 Mid row with scap retraction seated x 10 Seated rows 10# x15  IFC: supine for IFC B upper traps and middle traps with heat to assist with pain management x 12 min  10/11/22 UBE x 2 min fwd/x 3 min back Attempted lat pulls but pain going back up Rows 10# 2x5 B B ER and scap retraction YTB x 10  Chest press YTB x  10 Grip squeezes R hand x 10 with hand towel   Manual Therapy: Gentle STM to R wrist flexors   10/02/22: Therapeutic exercise:  Kinesiotaping:  Applied 2 -I pieces of kinesiotape to enhance centralization of humeral head and assist with stability/ function of R lateral and posterior shoulder stabilizers, one piece from ant deltoid to middle traps, and one middle deltoid to upper traps, with 35% pull  Instructed in therex to address the patient's stability B shoulders, utilizing rhythmic stabilization, instructed to utilize short amplitude movements, to the point of fatigue, and repeat 2-3 x a day  Iontophoresis:  patient still pt tender anterior / superior GH jt line, at attachment of long head of biceps tendon, applied ibresis, with 0/04% dexamethasome, advised to leave intact for 3 hrs post this appt for 80 MAM application    PATIENT EDUCATION:  POC, diagnosis, and prognosis.  Pt educated via explanation, demonstration, and handout (HEP).  Pt confirms understanding verbally.   HOME EXERCISE PROGRAM: Access Code: [email or web address] URL: [email or web address] Date: 10/02/2022 Prepared by: Amy Speaks  Exercises - Shoulder External Rotation and Scapular Retraction with Resistance  - 1 x daily - 7 x weekly - 3 sets - 10 reps - Standing Serratus Punch with Resistance  - 1 x daily - 7 x weekly - 3 sets - 10 reps  Access Code: [email or web address] URL: [email or web address] Date: 08/31/2022 Prepared by: Verta Ellen  Exercises - Seated Upper Trapezius Stretch  - 1 x daily - 7 x weekly - 3 sets - 10 sec hold - Gentle Levator Scapulae Stretch  - 1 x daily - 7 x weekly - 3 sets - 10 sec hold - Seated Wrist Flexion Stretch  -  1 x daily - 7 x weekly - 3 sets - 10 sec hold - Seated Wrist Extension Stretch  - 1 x daily - 7 x weekly - 3 sets - 10 sec hold  ASSESSMENT:  CLINICAL IMPRESSION: Donna Cowan reported increased pain with return to work, also complaining of Left sided  symptoms now.  With spurlings she was positive on L side for radicular symptoms.  We discussed need for follow-up with spine specialist and imaging as recommended by orthopedist, also recommended a different sports med doctor, however she is very stressed by all the copays from doctor visits and therapy, and became very tearful and upset at end of session.  During the session we did try cervical traction, tolerated fairly at first but then did report decreased tightness in her neck afterwards.   Given information on inexpensive self traction units from [employer].  Also reapplied tape to her shoulder she does find this quite helpful.  Recommended continue to use lidocaine patches as needed on shoulders for pain, no concerns about addiction (husband was worried).  At this time placing her on 30 day hold with recommendation to return to MD for more imaging.    OBJECTIVE IMPAIRMENTS: Pain, shoulder ROM, shoulder strength, balance  ACTIVITY LIMITATIONS: work, housework, lifting, reaching  PERSONAL FACTORS: See medical history and pertinent history   REHAB POTENTIAL: Fair unclear etiology of balance problem  CLINICAL DECISION MAKING: Evolving/moderate complexity  EVALUATION COMPLEXITY: Moderate   GOALS:   SHORT TERM GOALS: Target date: 09/26/2022   Donna Cowan will be >75% HEP compliant to improve carryover between sessions and facilitate independent management of condition  Evaluation: ongoing Goal status: MET- 09/28/22   LONG TERM GOALS: Target date: 10/24/2022   Donna Cowan will improve FOTO score to goal as a proxy for functional improvement  Evaluation/Baseline: TAKE VISIT 2 Goal status: Discontinued.  Patient transferred clinics and FOTO not set up.    2.  Donna Cowan will imporve BERG balance score to 47 pts, to show a significant improvement in balance in order to reduce fall risk  Evaluation/Baseline: 40 pts Goal status: MET- 54 / 56 = 96.4 %   3.  Donna Cowan will improve 30''  STS (MCID 2) to >/= 8x (w/ UE?: Y) to show improved LE strength and improved transfers   Evaluation/Baseline: 4x  w/ UE? Y Goal status: IN PROGRESS - 7x with UE support   4.  Donna Cowan will self report >/= 50% decrease in shoulder pain from evaluation   Evaluation/Baseline: 10/10 max pain Goal status: IN PROGRESS- 10/23/22 - 40%  5.  Donna Cowan will improve the following MMTs to >/= 4+/5 to show improvement in strength:     Goal status: IN PROGRESS - see chart    PLAN: PT FREQUENCY: 1-2x/week  PT DURATION: 8 weeks  PLANNED INTERVENTIONS: Therapeutic exercises, Aquatic therapy, Therapeutic activity, Neuro Muscular re-education, Gait training, Patient/Family education, Joint mobilization, Dry Needling, Electrical stimulation, Spinal mobilization and/or manipulation, Moist heat, Taping, Vasopneumatic device, Ionotophoresis 4mg /ml Dexamethasone, and Manual therapy  PLAN FOR NEXT SESSION:   30 day hold  Jena Gauss, PT, DPT  10/23/2022, 6:32 PM   PHYSICAL THERAPY DISCHARGE SUMMARY  Visits from Start of Care: 14  Current functional level related to goals / functional outcomes: See above   Remaining deficits: See above   Education / Equipment: HEP  Plan: Patient agrees to discharge.  Patient goals were not met. Refer to above clinical impression and goal assessment for status as of last visit on 10/23/2022. Patient  was placed on hold for 30 days and has not returned PT, therefore will proceed with discharge from PT for this episode.      Jena Gauss, PT  8:49 AM 12/11/2022

## 2023-04-18 ENCOUNTER — Emergency Department (HOSPITAL_BASED_OUTPATIENT_CLINIC_OR_DEPARTMENT_OTHER)

## 2023-04-18 ENCOUNTER — Emergency Department (HOSPITAL_BASED_OUTPATIENT_CLINIC_OR_DEPARTMENT_OTHER)
Admission: EM | Admit: 2023-04-18 | Discharge: 2023-04-18 | Disposition: A | Discharge status: Home / Self Care | Attending: Emergency Medicine | Admitting: Emergency Medicine

## 2023-04-18 ENCOUNTER — Other Ambulatory Visit: Payer: Self-pay

## 2023-04-18 ENCOUNTER — Encounter (HOSPITAL_BASED_OUTPATIENT_CLINIC_OR_DEPARTMENT_OTHER): Payer: Self-pay

## 2023-04-18 DIAGNOSIS — M25512 Pain in left shoulder: Secondary | ICD-10-CM | POA: Diagnosis present

## 2023-04-18 DIAGNOSIS — E039 Hypothyroidism, unspecified: Secondary | ICD-10-CM | POA: Insufficient documentation

## 2023-04-18 DIAGNOSIS — M792 Neuralgia and neuritis, unspecified: Secondary | ICD-10-CM | POA: Diagnosis not present

## 2023-04-18 HISTORY — DX: Fibromyalgia: M79.7

## 2023-04-18 LAB — BASIC METABOLIC PANEL
Anion gap: 8 (ref 5–15)
BUN: 16 mg/dL (ref 6–20)
CO2: 26 mmol/L (ref 22–32)
Calcium: 9.4 mg/dL (ref 8.9–10.3)
Chloride: 103 mmol/L (ref 98–111)
Creatinine, Ser: 0.94 mg/dL (ref 0.44–1.00)
GFR, Estimated: >60 mL/min (ref >60)
Glucose, Bld: 98 mg/dL (ref 70–99)
Potassium: 3.7 mmol/L (ref 3.5–5.1)
Sodium: 137 mmol/L (ref 135–145)

## 2023-04-18 LAB — CBC WITH DIFFERENTIAL/PLATELET
Abs Immature Granulocytes: 0.01 10*3/uL (ref 0.00–0.07)
Basophils Absolute: 0.1 10*3/uL (ref 0.0–0.1)
Basophils Relative: 1 %
Eosinophils Absolute: 0.1 10*3/uL (ref 0.0–0.5)
Eosinophils Relative: 1 %
HCT: 41.9 % (ref 36.0–46.0)
Hemoglobin: 13.8 g/dL (ref 12.0–15.0)
Immature Granulocytes: 0 %
Lymphocytes Relative: 45 %
Lymphs Abs: 3.2 10*3/uL (ref 0.7–4.0)
MCH: 29.9 pg (ref 26.0–34.0)
MCHC: 32.9 g/dL (ref 30.0–36.0)
MCV: 90.9 fL (ref 80.0–100.0)
Monocytes Absolute: 0.5 10*3/uL (ref 0.1–1.0)
Monocytes Relative: 7 %
Neutro Abs: 3.4 10*3/uL (ref 1.7–7.7)
Neutrophils Relative %: 46 %
Platelets: 233 10*3/uL (ref 150–400)
RBC: 4.61 MIL/uL (ref 3.87–5.11)
RDW: 14 % (ref 11.5–15.5)
WBC: 7.1 10*3/uL (ref 4.0–10.5)
nRBC: 0 % (ref 0.0–0.2)

## 2023-04-18 LAB — TROPONIN I (HIGH SENSITIVITY): Troponin I (High Sensitivity): 3 ng/L (ref <18)

## 2023-04-18 MED ORDER — CYCLOBENZAPRINE HCL 10 MG PO TABS
10.0000 mg | ORAL_TABLET | Freq: Once | ORAL | Status: AC
  Administered 2023-04-18: 10 mg via ORAL
  Filled 2023-04-18: qty 1

## 2023-04-18 MED ORDER — CYCLOBENZAPRINE HCL 10 MG PO TABS: 10.0000 mg | ORAL_TABLET | Freq: Two times a day (BID) | ORAL | 0 refills | Status: AC | PRN

## 2023-04-18 MED ORDER — MELOXICAM 15 MG PO TABS: 15.0000 mg | ORAL_TABLET | Freq: Every day | ORAL | 0 refills | Status: DC | PRN

## 2023-04-18 MED ORDER — LIDOCAINE 5 % EX PTCH: 1.0000 | MEDICATED_PATCH | CUTANEOUS | Status: DC

## 2023-04-18 MED ORDER — MELOXICAM 15 MG PO TABS: 15.0000 mg | ORAL_TABLET | Freq: Every day | ORAL | 0 refills | Status: AC | PRN

## 2023-04-18 MED ORDER — KETOROLAC TROMETHAMINE 30 MG/ML IJ SOLN
30.0000 mg | Freq: Once | INTRAMUSCULAR | Status: AC
  Administered 2023-04-18: 30 mg via INTRAMUSCULAR
  Filled 2023-04-18: qty 1

## 2023-04-18 MED ORDER — CYCLOBENZAPRINE HCL 10 MG PO TABS: 10.0000 mg | ORAL_TABLET | Freq: Two times a day (BID) | ORAL | 0 refills | Status: DC | PRN

## 2023-04-18 NOTE — ED Provider Notes (Signed)
  EMERGENCY DEPARTMENT AT [facility] Provider Note   CSN: [id number] Arrival date & time: 04/18/23  1926     History  Chief Complaint  Patient presents with   Back Pain    Donna Cowan is a 58 y.o. female.   Back Pain   58 year old female presents emergency department with complaints of left shoulder pain with radiating down to her left arm.  States that symptoms been present for about a week.  Has been trying gabapentin without significant improvement.  Went to an urgent care prior to coming to the ER given reported abnormal EKG.  Patient denies any chest pain or shortness of breath or exertional worsening symptoms.  States that symptoms are worsened during certain positions or whenever she hangs her left arm downward.  Denies any fevers, chills, cough, congestion, abdominal pain, nausea, vomiting.  Denies any history of DVT/PE, recent surgery/immobilization, known malignancy, known coagulopathy, or other  Past medical history significant for chronic back pain, chronic neck pain numbness, hypothyroidism, OSA, PTSD  Home Medications Prior to Admission medications   Medication Sig Start Date End Date Taking? Authorizing Provider  cyclobenzaprine (FLEXERIL) 10 MG tablet Take 1 tablet (10 mg total) by mouth 2 (two) times daily as needed for muscle spasms. 04/18/23  Yes Sherian Maroon A, PA  meloxicam (MOBIC) 15 MG tablet Take 1 tablet (15 mg total) by mouth daily as needed. 04/18/23  Yes Sherian Maroon A, PA  acetaminophen (TYLENOL) 325 MG tablet Take 2 tablets (650 mg total) by mouth every 6 (six) hours as needed. 07/14/22   Sloan Leiter, DO  cholecalciferol (VITAMIN D3) 25 MCG (1000 UNIT) tablet Take 1,000 Units by mouth daily.    [provider]  ibuprofen (ADVIL) 600 MG tablet Take 1 tablet (600 mg total) by mouth every 6 (six) hours as needed. 07/14/22   Sloan Leiter, DO  ketorolac (TORADOL) 10 MG tablet Take 10 mg by mouth 4 (four) times daily. 07/16/22    [provider]  levothyroxine (SYNTHROID) 137 MCG tablet Take 1 tablet (137 mcg total) by mouth daily before breakfast. 01/03/21   Shirlean Mylar, MD  lidocaine (LIDODERM) 5 % Place 1 patch onto the skin daily as needed. Remove & Discard patch within 12 hours or as directed by MD 07/14/22   Sloan Leiter, DO      Allergies    Codeine    Review of Systems   Review of Systems  Musculoskeletal:  Positive for back pain.  All other systems reviewed and are negative.   Physical Exam Updated Vital Signs BP (!) 173/89 (BP Location: Right Arm)   Pulse 72   Temp (!) 97.5 F (36.4 C) (Tympanic)   Resp 18   Ht 5\' 6"  (1.676 m)   Wt 89.8 kg   SpO2 100%   BMI 31.96 kg/m  Physical Exam Vitals and nursing note reviewed.  Constitutional:      General: She is not in acute distress.    Appearance: She is well-developed.  HENT:     Head: Normocephalic and atraumatic.  Eyes:     Conjunctiva/sclera: Conjunctivae normal.  Cardiovascular:     Rate and Rhythm: Normal rate and regular rhythm.     Pulses: Normal pulses.     Heart sounds: No murmur heard. Pulmonary:     Effort: Pulmonary effort is normal. No respiratory distress.     Breath sounds: Normal breath sounds. No wheezing, rhonchi or rales.  Abdominal:  Palpations: Abdomen is soft.     Tenderness: There is no abdominal tenderness.  Musculoskeletal:        General: No swelling.     Cervical back: Neck supple.     Comments: Tender palpation paraspinal musculature sided cervical region with tenderness along left trapezial ridge.  Radial pulses 2+ bilaterally.  No bony tenderness of left upper extremity.  Skin:    General: Skin is warm and dry.     Capillary Refill: Capillary refill takes less than 2 seconds.  Neurological:     Mental Status: She is alert.  Psychiatric:        Mood and Affect: Mood normal.     ED Results / Procedures / Treatments   Labs (all labs ordered are listed, but only abnormal results are  displayed) Labs Reviewed  CBC WITH DIFFERENTIAL/PLATELET  BASIC METABOLIC PANEL  TROPONIN I (HIGH SENSITIVITY)    EKG EKG Interpretation Date/Time:  Thursday April 18 2023 19:52:16 EST Ventricular Rate:  72 PR Interval:  184 QRS Duration:  87 QT Interval:  409 QTC Calculation: 448 R Axis:   57  Text Interpretation: Sinus rhythm Confirmed by Virgina Norfolk [phone]) on 04/18/2023 7:53:55 PM  Radiology DG Chest 2 View Result Date: 04/18/2023 CLINICAL DATA:  Back pain that runs down the left arm. EXAM: CHEST - 2 VIEW COMPARISON:  None Available. FINDINGS: The heart size and mediastinal contours are within normal limits. Both lungs are clear. The visualized skeletal structures are unremarkable. IMPRESSION: No active cardiopulmonary disease. Electronically Signed   By: Aram Candela M.D.   On: 04/18/2023 22:27    Procedures Procedures    Medications Ordered in ED Medications  ketorolac (TORADOL) 30 MG/ML injection 30 mg (30 mg Intramuscular Given 04/18/23 2259)  cyclobenzaprine (FLEXERIL) tablet 10 mg (10 mg Oral Given 04/18/23 2259)    ED Course/ Medical Decision Making/ A&P                                 Medical Decision Making Amount and/or Complexity of Data Reviewed Labs: ordered. Radiology: ordered.  Risk Prescription drug management.   This patient presents to the ED for concern of arm pain, this involves an extensive number of treatment options, and is a complaint that carries with it a high risk of complications and morbidity.  The differential diagnosis includes fracture, strain/pain, dislocation, ligament/tendinous injury, neurovasc compromise, ACS, PE, aortic dissection, DVT, other   Co morbidities that complicate the patient evaluation  See HPI   Additional history obtained:  Additional history obtained from EMR External records from outside source obtained and reviewed including hospital records   Lab Tests:  I Ordered, and personally interpreted  labs.  The pertinent results include: No leukocytosis.  No evidence of anemia.  Platelets within range.  No Electra imbalance.  No renal dysfunction.  Troponin of 3   Imaging Studies ordered:  I ordered imaging studies including chest x-ray. I independently visualized and interpreted imaging which showed no acute cardiopulmonary maladies I agree with the radiologist interpretation   Cardiac Monitoring: / EKG:  The patient was maintained on a cardiac monitor.  I personally viewed and interpreted the cardiac monitored which showed an underlying rhythm of: Sinus rhythm   Consultations Obtained:  N/a   Problem List / ED Course / Critical interventions / Medication management  Radicular left arm pain I ordered medication including Toradol, Flexeril   Reevaluation of the patient  after these medicines showed that the patient improved I have reviewed the patients home medicines and have made adjustments as needed   Social Determinants of Health:  Denies tobacco, licit drug use.   Test / Admission - Considered:  Radicular left arm pain Vitals signs significant for hypertension. Otherwise within normal range and stable throughout visit. Laboratory/imaging studies significant for: See above 58 year old female presents emergency department with complaints of pain radiating from her left shoulder down her left arm.  Symptoms present for about a week.  On exam, tender to palpation paraspinally cervical region along trapezial ridge.  Pain worsened with ranging left shoulder and relieved with holding arm in sling like position.  Patient without chest pain rating to back, obvious pulse deficits, neurodeficits; low suspicion for aortic dissection.  Patient with negative troponin, lack of acute eczema generated on EKG; low suspicion for ACS.  Patient with heart score of 0-3.  Patient without risk factors for PE/DVT and without tachycardia, tachypnea, hypoxia; low suspicion for PE.  No upper  extremity edema concerning for DVT.  No overlying skin changes concerning for secondary infectious process.  Suspect patient symptoms likely secondary to cervical radiculopathy.  Treated with dose of Toradol and Flexeril while in the ED and did note improvement.  Will send home with anti-inflammatories as well as muscle laxer to use as needed.  Follow-up PCP recommended for reevaluation of symptoms.  Treatment plan discussed length with patient and she acknowledged understanding was agreeable to said plan.  Patient overall well-appearing, afebrile in no acute distress. Worrisome signs and symptoms were discussed with the patient, and the patient acknowledged understanding to return to the ED if noticed. Patient was stable upon discharge.          Final Clinical Impression(s) / ED Diagnoses Final diagnoses:  Radicular pain in left arm    Rx / DC Orders ED Discharge Orders          Ordered    meloxicam (MOBIC) 15 MG tablet  Daily PRN        04/18/23 2305    cyclobenzaprine (FLEXERIL) 10 MG tablet  2 times daily PRN        04/18/23 2305              Peter Garter, PA 04/18/23 2326    Virgina Norfolk, DO 04/19/23 1514

## 2023-04-18 NOTE — Discharge Instructions (Signed)
 As discussed, I suspect that you are having nervelike pain going on the left arm most likely from the muscles in your back.  Your workup did not overall was reassuring.  Your heart enzyme was normal.  Your EKG and chest x-ray appeared normal.  Will treat with anti-inflammatories as well as muscle laxer.  Continue use topical numbing agents.  You may benefit from massage therapy as well.  Recommend follow-up with primary care for reassessment of your symptoms.  Please not hesitate to return to the emergency department if the worrisome signs and symptoms we discussed become apparent.

## 2023-04-18 NOTE — ED Triage Notes (Signed)
 Pt states she has been having back pain that runs down her left arm since Friday, was seen PCP on Monday with no new medication changes. Went to urgent care today & was told to come to ER d/t concerning EKG. Denies N/V/D, SOB. No changes in pain other than it is more constant today.
